# Patient Record
Sex: Female | Born: 1940 | Race: White | Hispanic: No | Smoking: Never smoker
Health system: Southern US, Community
[De-identification: ages and names within clinical notes are randomized; demographics above are authoritative.]

## PROBLEM LIST (undated history)

## (undated) DIAGNOSIS — C4491 Basal cell carcinoma of skin, unspecified: Secondary | ICD-10-CM

## (undated) DIAGNOSIS — L570 Actinic keratosis: Secondary | ICD-10-CM

## (undated) HISTORY — DX: Basal cell carcinoma of skin, unspecified: C44.91

## (undated) HISTORY — PX: OOPHORECTOMY: SHX86

## (undated) HISTORY — PX: ABDOMINAL HYSTERECTOMY: SHX81

## (undated) HISTORY — DX: Actinic keratosis: L57.0

---

## 2018-01-18 DIAGNOSIS — E785 Hyperlipidemia, unspecified: Secondary | ICD-10-CM | POA: Insufficient documentation

## 2018-01-23 ENCOUNTER — Other Ambulatory Visit: Payer: Self-pay | Admitting: Certified Nurse Midwife

## 2018-01-23 DIAGNOSIS — Z1231 Encounter for screening mammogram for malignant neoplasm of breast: Secondary | ICD-10-CM

## 2018-02-02 ENCOUNTER — Other Ambulatory Visit: Payer: Self-pay | Admitting: *Deleted

## 2018-02-02 ENCOUNTER — Inpatient Hospital Stay
Admission: RE | Admit: 2018-02-02 | Discharge: 2018-02-02 | Disposition: A | Discharge status: Home / Self Care | Payer: Self-pay | Source: Ambulatory Visit | Attending: *Deleted | Admitting: *Deleted

## 2018-02-02 DIAGNOSIS — Z9289 Personal history of other medical treatment: Secondary | ICD-10-CM

## 2018-02-07 ENCOUNTER — Encounter: Payer: Self-pay | Admitting: Radiology

## 2018-02-07 ENCOUNTER — Ambulatory Visit
Admission: RE | Admit: 2018-02-07 | Discharge: 2018-02-07 | Disposition: A | Discharge status: Home / Self Care | Payer: Medicare HMO | Source: Ambulatory Visit | Attending: Certified Nurse Midwife | Admitting: Certified Nurse Midwife

## 2018-02-07 DIAGNOSIS — Z1231 Encounter for screening mammogram for malignant neoplasm of breast: Secondary | ICD-10-CM

## 2019-03-14 ENCOUNTER — Other Ambulatory Visit: Payer: Self-pay | Admitting: Internal Medicine

## 2019-03-14 DIAGNOSIS — Z1231 Encounter for screening mammogram for malignant neoplasm of breast: Secondary | ICD-10-CM

## 2019-05-03 ENCOUNTER — Ambulatory Visit
Admission: RE | Admit: 2019-05-03 | Discharge: 2019-05-03 | Disposition: A | Discharge status: Home / Self Care | Payer: Medicare HMO | Source: Ambulatory Visit | Attending: Internal Medicine | Admitting: Internal Medicine

## 2019-05-03 DIAGNOSIS — Z1231 Encounter for screening mammogram for malignant neoplasm of breast: Secondary | ICD-10-CM | POA: Diagnosis present

## 2020-01-17 ENCOUNTER — Other Ambulatory Visit: Payer: Self-pay

## 2020-01-17 ENCOUNTER — Encounter: Payer: Self-pay | Admitting: Urology

## 2020-01-17 ENCOUNTER — Ambulatory Visit (INDEPENDENT_AMBULATORY_CARE_PROVIDER_SITE_OTHER): Payer: Medicare HMO | Admitting: Urology

## 2020-01-17 VITALS — BP 135/83 | HR 68 | Ht 64.0 in | Wt 190.0 lb

## 2020-01-17 DIAGNOSIS — N390 Urinary tract infection, site not specified: Secondary | ICD-10-CM | POA: Diagnosis not present

## 2020-01-18 ENCOUNTER — Encounter: Payer: Self-pay | Admitting: Urology

## 2020-01-18 NOTE — Progress Notes (Signed)
   01/17/2020 7:17 AM   Jessica Brewer 08/06/1940 [id number]  Referring provider: Adin Hector, MD [facility] [place],  [place] [address]  Chief Complaint  Patient presents with  . Recurrent UTI    HPI: Jessica Brewer is a 79 y.o. female seen at request of Dr. Caryl Brewer for recurrent UTI.   States she has been treated for 3 UTIs over the last 6 weeks  Review of available records remarkable for urinalysis 12/04/2019 showing pyuria and urine culture positive E. Coli  Typical symptoms include urinary urgency with pain that radiates up abdomen to chest, dysuria and voiding small amounts  Symptoms resolved with antibiotic therapy  Completed antibiotic course approximately 1 week ago  No symptoms related to intercourse  No febrile UTIs or pyelonephritis  Taking cranberry supplements   PMH: No past medical history on file.  Surgical History: Past Surgical History:  Procedure Laterality Date  . ABDOMINAL HYSTERECTOMY    . OOPHORECTOMY      Home Medications:  Allergies as of 01/17/2020      Reactions   Codeine Other (See Comments)   Sweats, Chills   Sulfa Antibiotics Other (See Comments), Nausea Only   Pravastatin Other (See Comments)      Medication List       Accurate as of January 17, 2020 11:59 PM. If you have any questions, ask your nurse or doctor.        albuterol 108 (90 Base) MCG/ACT inhaler Commonly known as: VENTOLIN HFA SMARTSIG:2 Puff(s) By Mouth Brewer 6 Hours PRN   ascorbic acid 500 MG tablet Commonly known as: VITAMIN C Take by mouth.   benzonatate 200 MG capsule Commonly known as: TESSALON Take by mouth.   Biotin 1 MG Caps Take by mouth.   loratadine 10 MG tablet Commonly known as: CLARITIN Take by mouth.   Maximum Red Krill 300 MG Caps Take by mouth.   SM Cranberry 300 MG tablet Generic drug: Cranberry Take by mouth.       Allergies:  Allergies  Allergen Reactions  . Codeine Other (See Comments)     Sweats, Chills  . Sulfa Antibiotics Other (See Comments) and Nausea Only  . Pravastatin Other (See Comments)    Family History: Family History  Problem Relation Age of Onset  . Breast cancer Mother     Social History:  reports that she has never smoked. She has never used smokeless tobacco. She reports current alcohol use. No history on file for drug use.   Physical Exam: BP (!) 135/83   Pulse 68   Ht 5\' 4"  (1.626 m)   Wt 190 lb (86.2 kg)   BMI 32.61 kg/m   Constitutional:  Alert and oriented, No acute distress. HEENT: [place] AT, moist mucus membranes.  Trachea midline, no masses. Cardiovascular: No clubbing, cyanosis, or edema. Respiratory: Normal respiratory effort, no increased work of breathing. Skin: No rashes, bruises or suspicious lesions. Neurologic: Grossly intact, no focal deficits, moving all 4 extremities. Psychiatric: Normal mood and affect.   Assessment & Plan:    1. Recurrent UTI  Voided prior to office visit however urinalysis PCP earlier this week was negative  Schedule screening renal ultrasound with follow-up  Discussed etiologies of recurrent UTIs in postmenopausal women  Continue cranberry  Start D-mannose   Jessica Sons, MD  [facility] [address], [place], [place] [address] [phone]

## 2020-02-06 ENCOUNTER — Other Ambulatory Visit: Payer: Self-pay | Admitting: Certified Nurse Midwife

## 2020-02-06 DIAGNOSIS — Z1231 Encounter for screening mammogram for malignant neoplasm of breast: Secondary | ICD-10-CM

## 2020-02-21 ENCOUNTER — Ambulatory Visit: Payer: Medicare HMO | Admitting: Dermatology

## 2020-02-21 ENCOUNTER — Other Ambulatory Visit: Payer: Self-pay

## 2020-02-21 ENCOUNTER — Encounter: Payer: Self-pay | Admitting: Dermatology

## 2020-02-21 DIAGNOSIS — L821 Other seborrheic keratosis: Secondary | ICD-10-CM

## 2020-02-21 DIAGNOSIS — L570 Actinic keratosis: Secondary | ICD-10-CM

## 2020-02-21 DIAGNOSIS — L578 Other skin changes due to chronic exposure to nonionizing radiation: Secondary | ICD-10-CM

## 2020-02-21 DIAGNOSIS — Z85828 Personal history of other malignant neoplasm of skin: Secondary | ICD-10-CM

## 2020-02-21 DIAGNOSIS — L738 Other specified follicular disorders: Secondary | ICD-10-CM | POA: Diagnosis not present

## 2020-02-21 DIAGNOSIS — L820 Inflamed seborrheic keratosis: Secondary | ICD-10-CM

## 2020-02-21 DIAGNOSIS — Z1283 Encounter for screening for malignant neoplasm of skin: Secondary | ICD-10-CM

## 2020-02-21 NOTE — Progress Notes (Signed)
New Patient Visit  Subjective  Jessica Brewer is a 79 y.o. female who presents for the following: multiple scattered skin lesions (irregular and irritated - R cheek, forehead, legs, neck. Patient is concerned and would like them checked). The patient presents for Upper Body Skin Exam (UBSE) for skin cancer screening and mole check.  The following portions of the chart were reviewed this encounter and updated as appropriate:  Tobacco  Allergies  Meds  Problems  Med Hx  Surg Hx  Fam Hx     Review of Systems:  No other skin or systemic complaints except as noted in HPI or Assessment and Plan.  Objective  Well appearing patient in no apparent distress; mood and affect are within normal limits.  A focused examination was performed including face, neck, arms, legs. Relevant physical exam findings are noted in the Assessment and Plan.  Objective  R of midline forehead x 1, R calf x 1, L pretibial x 1, B/L arms x 3, L shoulder x 14 (20): Erythematous keratotic or waxy stuck-on papule or plaque.   Objective  R cheek prox mandible: Erythematous thin papules/macules with gritty scale.   Objective  Face: Small yellow papules with a central dell.   Objective  Nose: Well healed scar with no evidence of recurrence.   Assessment & Plan  Inflamed seborrheic keratosis (20) R of midline forehead x 1, R calf x 1, L pretibial x 1, B/L arms x 3, L shoulder x 14  On the L pretibial there is an ISK with underlying cyst - if not resolved at follow up we may have to biopsy lesion.  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    History of basal cell carcinoma (BCC) Nose  Clear. Observe for recurrence. Call clinic for new or changing lesions.  Recommend regular skin exams, daily broad-spectrum spf 30+ sunscreen use, and photoprotection.     Skin cancer screening   Seborrheic Keratoses - Stuck-on, waxy, tan-brown papules and plaques  - Discussed benign etiology and prognosis. - Observe - Call for any changes  Actinic Damage - diffuse scaly erythematous macules with underlying dyspigmentation - Recommend daily broad spectrum sunscreen SPF 30+ to sun-exposed areas, reapply every 2 hours as needed.  - Call for new or changing lesions.  Return in about 2 months (around 04/22/2020).  Luther Redo, CMA, am acting as scribe for Sarina Ser, MD .  Documentation: I have reviewed the above documentation for  accuracy and completeness, and I agree with the above.  Sarina Ser, MD

## 2020-02-22 ENCOUNTER — Ambulatory Visit: Payer: Medicare HMO | Admitting: Physician Assistant

## 2020-02-22 VITALS — BP 169/98 | HR 73

## 2020-02-22 DIAGNOSIS — R300 Dysuria: Secondary | ICD-10-CM

## 2020-02-22 LAB — URINALYSIS, COMPLETE
Bilirubin, UA: NEGATIVE
Glucose, UA: NEGATIVE
Ketones, UA: NEGATIVE
Nitrite, UA: NEGATIVE
Protein,UA: NEGATIVE
Specific Gravity, UA: 1.015 (ref 1.005–1.030)
Urobilinogen, Ur: 0.2 mg/dL (ref 0.2–1.0)
pH, UA: 6 (ref 5.0–7.5)

## 2020-02-22 LAB — MICROSCOPIC EXAMINATION: WBC, UA: >30 /hpf — AB (ref 0–5)

## 2020-02-22 MED ORDER — NITROFURANTOIN MONOHYD MACRO 100 MG PO CAPS: 100.0000 mg | ORAL_CAPSULE | Freq: Two times a day (BID) | ORAL | 0 refills | Status: AC

## 2020-02-22 NOTE — Progress Notes (Signed)
02/22/2020 3:10 PM   Jessica Brewer 1941/04/11 [id number]  CC: Chief Complaint  Patient presents with  . Recurrent UTI    HPI: Jessica Brewer is a 79 y.o. female with a recent history of recurrent UTI who presents today for evaluation of possible UTI. She is an established [facility] patient who last saw Dr. Bernardo Heater on January 17, 2020 for evaluation of recurrent UTI.  She is scheduled for follow-up with renal ultrasound prior later this month.  Today she reports a 1 day history of urinary frequency and dysuria.  Consistent with prior, she reports pain that radiates of her abdomen to her chest with this.  She denies fever, chills, nausea, vomiting, and gross hematuria.  She has not taken any medications at home to treat her symptoms.  Per chart review, most recent positive urine culture dated 12/04/2019 resulted with pansensitive E. coli.  In-office UA today positive for 2+ blood and 2+ leukocyte esterase; urine microscopy with >30 WBCs/HPF.  PMH: Past Medical History:  Diagnosis Date  . Basal cell carcinoma    nose - treated in [place]    Surgical History: Past Surgical History:  Procedure Laterality Date  . ABDOMINAL HYSTERECTOMY    . OOPHORECTOMY      Home Medications:  Allergies as of 02/22/2020      Reactions   Codeine Other (See Comments)   Sweats, Chills   Sulfa Antibiotics Other (See Comments), Nausea Only   Pravastatin Other (See Comments)      Medication List       Accurate as of February 22, 2020  3:10 PM. If you have any questions, ask your nurse or doctor.        Advair Diskus 250-50 MCG/DOSE Aepb Generic drug: Fluticasone-Salmeterol SMARTSIG:1 Inhalation Via Inhaler Every 12 Hours   albuterol 108 (90 Base) MCG/ACT inhaler Commonly known as: VENTOLIN HFA SMARTSIG:2 Puff(s) By Mouth Every 6 Hours PRN   ascorbic acid 500 MG tablet Commonly known as: VITAMIN C Take by mouth.   benzonatate 200 MG capsule Commonly known as: TESSALON Take by mouth.   Biotin 1 MG  Caps Take by mouth.   loratadine 10 MG tablet Commonly known as: CLARITIN Take by mouth.   Maximum Red Krill 300 MG Caps Take by mouth.   nitrofurantoin (macrocrystal-monohydrate) 100 MG capsule Commonly known as: MACROBID Take 1 capsule (100 mg total) by mouth every 12 (twelve) hours for 5 days.   SM Cranberry 300 MG tablet Generic drug: Cranberry Take by mouth.      Allergies:  Allergies  Allergen Reactions  . Codeine Other (See Comments)    Sweats, Chills  . Sulfa Antibiotics Other (See Comments) and Nausea Only  . Pravastatin Other (See Comments)    Family History: Family History  Problem Relation Age of Onset  . Breast cancer Mother     Social History:   reports that she has never smoked. She has never used smokeless tobacco. She reports current alcohol use. No history on file for drug use.  Physical Exam: BP (!) 169/98   Pulse 73   Constitutional:  Alert and oriented, no acute distress, nontoxic appearing HEENT: [place], AT Cardiovascular: No clubbing, cyanosis, or edema Respiratory: Normal respiratory effort, no increased work of breathing Skin: No rashes, bruises or suspicious lesions Neurologic: Grossly intact, no focal deficits, moving all 4 extremities Psychiatric: Normal mood and affect  Laboratory Data: Results for orders placed or performed in visit on 02/22/20  CULTURE, URINE COMPREHENSIVE   Specimen: Urine  UR  Result Value Ref Range   Urine Culture, Comprehensive Preliminary report (A)    Organism ID, Bacteria Gram negative rods (A)   Microscopic Examination   Urine  Result Value Ref Range   WBC, UA >30 (A) 0 - 5 /hpf   RBC 0-2 0 - 2 /hpf   Epithelial Cells (non renal) 0-10 0 - 10 /hpf   Bacteria, UA Few None seen/Few  Urinalysis, Complete  Result Value Ref Range   Specific Gravity, UA 1.015 1.005 - 1.030   pH, UA 6.0 5.0 - 7.5   Color, UA Yellow Yellow   Appearance Ur Cloudy (A) Clear   Leukocytes,UA 2+ (A) Negative   Protein,UA  Negative Negative/Trace   Glucose, UA Negative Negative   Ketones, UA Negative Negative   RBC, UA 2+ (A) Negative   Bilirubin, UA Negative Negative   Urobilinogen, Ur 0.2 0.2 - 1.0 mg/dL   Nitrite, UA Negative Negative   Microscopic Examination See below:     Assessment & Plan:   1. Dysuria UA today notable for pyuria consistent with prior past positive urine culture.  I am starting her on empiric Macrobid and sending her urine for culture today for further evaluation.  I explained that I would contact her if her culture results indicate a necessary change in therapy.  She expressed understanding. - Urinalysis, Complete - CULTURE, URINE COMPREHENSIVE - nitrofurantoin, macrocrystal-monohydrate, (MACROBID) 100 MG capsule; Take 1 capsule (100 mg total) by mouth every 12 (twelve) hours for 5 days.  Dispense: 10 capsule; Refill: 0  Return if symptoms worsen or fail to improve.  Debroah Loop, PA-C  [facility] [address], [place], [place] [address] [phone]

## 2020-02-27 ENCOUNTER — Encounter: Payer: Self-pay | Admitting: Dermatology

## 2020-02-29 LAB — CULTURE, URINE COMPREHENSIVE

## 2020-03-03 ENCOUNTER — Ambulatory Visit
Admission: RE | Admit: 2020-03-03 | Discharge: 2020-03-03 | Disposition: A | Discharge status: Home / Self Care | Payer: Medicare HMO | Source: Ambulatory Visit | Attending: Urology | Admitting: Urology

## 2020-03-03 ENCOUNTER — Other Ambulatory Visit: Payer: Self-pay

## 2020-03-03 DIAGNOSIS — N390 Urinary tract infection, site not specified: Secondary | ICD-10-CM | POA: Insufficient documentation

## 2020-03-07 ENCOUNTER — Ambulatory Visit: Payer: Medicare HMO | Admitting: Urology

## 2020-03-07 ENCOUNTER — Other Ambulatory Visit: Payer: Self-pay

## 2020-03-07 ENCOUNTER — Encounter: Payer: Self-pay | Admitting: Urology

## 2020-03-07 VITALS — BP 136/75 | HR 75 | Ht 64.0 in | Wt 194.0 lb

## 2020-03-07 DIAGNOSIS — R300 Dysuria: Secondary | ICD-10-CM

## 2020-03-07 DIAGNOSIS — N390 Urinary tract infection, site not specified: Secondary | ICD-10-CM

## 2020-03-07 MED ORDER — TRIMETHOPRIM 100 MG PO TABS: 100.0000 mg | ORAL_TABLET | Freq: Every day | ORAL | 0 refills | Status: AC

## 2020-03-07 NOTE — Progress Notes (Signed)
03/07/2020 12:34 PM   Jessica Brewer 1941-03-05 [id number]  Referring provider: Adin Hector, MD [facility] [place],  [place] [address]  Chief Complaint  Patient presents with  . Results    40mo w/RUS    HPI: 79 y.o. female presents for follow-up of recurrent UTI.   I initially saw 01/17/2020 and recommended d-mannose, cranberry and screening renal ultrasound  Saw Sam Vaillancourt earlier this month with recurrent symptoms and urine culture positive for E. Coli  Renal ultrasound performed 03/03/2020 showed no significant abnormalities.  There was a small 13 mm simple left renal cyst  Resolution of symptoms after completion of recent antibiotic course   PMH: Past Medical History:  Diagnosis Date  . Basal cell carcinoma    nose - [place] in [place]    Surgical History: Past Surgical History:  Procedure Laterality Date  . ABDOMINAL HYSTERECTOMY    . OOPHORECTOMY      Home Medications:  Allergies as of 03/07/2020      Reactions   Codeine Other (See Comments)   Sweats, Chills   Sulfa Antibiotics Other (See Comments), Nausea Only   Pravastatin Other (See Comments)      Medication List       Accurate as of March 07, 2020 12:34 PM. If you have any questions, ask your nurse or doctor.        STOP taking these medications   albuterol 108 (90 Base) MCG/ACT inhaler Commonly known as: VENTOLIN HFA Stopped by: Abbie Sons, MD     TAKE these medications   Advair Diskus 250-50 MCG/DOSE Aepb Generic drug: Fluticasone-Salmeterol SMARTSIG:1 Inhalation Via Inhaler Brewer 12 Hours   ascorbic acid 500 MG tablet Commonly known as: VITAMIN C Take by mouth.   benzonatate 200 MG capsule Commonly known as: TESSALON Take by mouth.   Biotin 1 MG Caps Take by mouth.   loratadine 10 MG tablet Commonly known as: CLARITIN Take by mouth.   Maximum Red Krill 300 MG Caps Take by mouth.   SM Cranberry 300 MG tablet Generic drug:  Cranberry Take by mouth.       Allergies:  Allergies  Allergen Reactions  . Codeine Other (See Comments)    Sweats, Chills  . Sulfa Antibiotics Other (See Comments) and Nausea Only  . Pravastatin Other (See Comments)    Family History: Family History  Problem Relation Age of Onset  . Breast cancer Mother     Social History:  reports that she has never smoked. She has never used smokeless tobacco. She reports current alcohol use. No history on file for drug use.   Physical Exam: BP 136/75   Pulse 75   Ht 5\' 4"  (1.626 m)   Wt 194 lb (88 kg)   BMI 33.30 kg/m   Constitutional:  Alert and oriented, No acute distress. HEENT: [place] AT, moist mucus membranes.  Trachea midline, no masses. Cardiovascular: No clubbing, cyanosis, or edema. Respiratory: Normal respiratory effort, no increased work of breathing. Skin: No rashes, bruises or suspicious lesions. Neurologic: Grossly intact, no focal deficits, moving all 4 extremities. Psychiatric: Normal mood and affect.   Pertinent Imaging: Images were personally reviewed  Ultrasound renal complete  Narrative CLINICAL DATA:  Recurring UTI.  EXAM: RENAL / URINARY TRACT ULTRASOUND COMPLETE  COMPARISON:  None.  FINDINGS: Right Kidney:  Renal measurements: 11.8 cm x 3.7 cm x 5.2 cm = volume: 117.2 mL. Echogenicity within normal limits. No mass or hydronephrosis visualized.  Left  Kidney:  Renal measurements: 10.2 cm x 5.9 cm x 4.5 cm = volume: 142.6 mL. Echogenicity within normal limits. A 1.3 cm x 1.1 cm x 1.0 cm anechoic structure is seen within the lower pole of the left kidney. No hydronephrosis visualized.  Bladder:  Appears normal for degree of bladder distention.  Other:  None.  IMPRESSION: Small left renal cyst.   Electronically Signed By: Virgina Norfolk M.D. On: 03/04/2020 03:52   Assessment & Plan:    1. Recurrent UTI  Continue cranberry, d-mannose  84-month trial low-dose antibiotic  prophylaxis trimethoprim 100 mg daily  PA follow-up 3 months  Call earlier for recurrent UTI symptoms   Abbie Sons, MD  [facility] [address], [place], [place] [address] [phone]

## 2020-03-09 ENCOUNTER — Encounter: Payer: Self-pay | Admitting: Urology

## 2020-03-10 LAB — URINALYSIS, COMPLETE
Bilirubin, UA: NEGATIVE
Glucose, UA: NEGATIVE
Ketones, UA: NEGATIVE
Leukocytes,UA: NEGATIVE
Nitrite, UA: NEGATIVE
Protein,UA: NEGATIVE
RBC, UA: NEGATIVE
Specific Gravity, UA: 1.02 (ref 1.005–1.030)
Urobilinogen, Ur: 0.2 mg/dL (ref 0.2–1.0)
pH, UA: 5.5 (ref 5.0–7.5)

## 2020-03-10 LAB — MICROSCOPIC EXAMINATION: Bacteria, UA: NONE SEEN

## 2020-04-23 ENCOUNTER — Other Ambulatory Visit: Payer: Self-pay

## 2020-04-23 ENCOUNTER — Encounter: Payer: Self-pay | Admitting: Dermatology

## 2020-04-23 ENCOUNTER — Ambulatory Visit: Payer: Medicare HMO | Admitting: Dermatology

## 2020-04-23 DIAGNOSIS — L578 Other skin changes due to chronic exposure to nonionizing radiation: Secondary | ICD-10-CM

## 2020-04-23 DIAGNOSIS — I781 Nevus, non-neoplastic: Secondary | ICD-10-CM | POA: Diagnosis not present

## 2020-04-23 DIAGNOSIS — L821 Other seborrheic keratosis: Secondary | ICD-10-CM | POA: Diagnosis not present

## 2020-04-23 DIAGNOSIS — L570 Actinic keratosis: Secondary | ICD-10-CM

## 2020-04-23 DIAGNOSIS — L820 Inflamed seborrheic keratosis: Secondary | ICD-10-CM | POA: Diagnosis not present

## 2020-04-23 DIAGNOSIS — L719 Rosacea, unspecified: Secondary | ICD-10-CM

## 2020-04-23 MED ORDER — METRONIDAZOLE 1 % EX GEL: Freq: Every day | CUTANEOUS | 0 refills | Status: AC

## 2020-04-23 NOTE — Patient Instructions (Signed)

## 2020-04-23 NOTE — Progress Notes (Signed)
   Follow-Up Visit   Subjective  Jessica Brewer is a 79 y.o. female who presents for the following: Actinic Keratosis (follow up - right prox mandible treated with LN2) and Other (ISK follow up treated with LN2).  The following portions of the chart were reviewed this encounter and updated as appropriate:  Tobacco  Allergies  Meds  Problems  Med Hx  Surg Hx  Fam Hx     Review of Systems:  No other skin or systemic complaints except as noted in HPI or Assessment and Plan.  Objective  Well appearing patient in no apparent distress; mood and affect are within normal limits.  A focused examination was performed including trunk, arms, face. Relevant physical exam findings are noted in the Assessment and Plan.  Objective  Back x 2, left chest x 2, right tricep x 1, left tricep x 1 (6): Erythematous keratotic or waxy stuck-on papule or plaque.   Objective  Right cheek (4): Erythematous thin papules/macules with gritty scale.   Objective  Head - Anterior (Face): Erythema and small pustules.  Objective  Left 5th knuckle: Dilated blood vessels   Assessment & Plan    Actinic Damage - chronic, secondary to cumulative UV radiation exposure/sun exposure over time - diffuse scaly erythematous macules with underlying dyspigmentation - Recommend daily broad spectrum sunscreen SPF 30+ to sun-exposed areas, reapply every 2 hours as needed.  - Call for new or changing lesions.  Seborrheic Keratoses - Stuck-on, waxy, tan-brown papules and plaques  - Discussed benign etiology and prognosis. - Observe - Call for any changes   Inflamed seborrheic keratosis x 6 Back x 2, left chest x 2, right tricep x 1, left tricep x 1  Destruction of lesion - Back x 2, left chest x 2, right tricep x 1, left tricep x 1 Complexity: simple   Destruction method: cryotherapy   Informed consent: discussed and consent obtained   Timeout:  patient name, date of birth, surgical site, and procedure  verified Lesion destroyed using liquid nitrogen: Yes   Region frozen until ice ball extended beyond lesion: Yes   Outcome: patient tolerated procedure well with no complications   Post-procedure details: wound care instructions given    AK (actinic keratosis) (4) Right cheek  Destruction of lesion - Right cheek Complexity: simple   Destruction method: cryotherapy   Informed consent: discussed and consent obtained   Timeout:  patient name, date of birth, surgical site, and procedure verified Lesion destroyed using liquid nitrogen: Yes   Region frozen until ice ball extended beyond lesion: Yes   Outcome: patient tolerated procedure well with no complications   Post-procedure details: wound care instructions given    Rosacea Head - Anterior (Face) Rosacea is a chronic progressive skin condition usually affecting the face of adults. It is treatable but not curable. It sometimes affects the eyes (ocular rosacea) as well. It may respond to topical and/or systemic medication and can flare with stress, sun exposure, alcohol, exercise and some foods.  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   Return in about 6 months (around 10/21/2020).   I, Ashok Cordia, CMA, am acting as scribe for Sarina Ser, MD .  Documentation: I have reviewed the above documentation for accuracy and completeness, and I agree with the above.  Sarina Ser, MD

## 2020-04-29 ENCOUNTER — Telehealth: Payer: Self-pay

## 2020-04-29 NOTE — Telephone Encounter (Signed)
Send [facility] Triple Rosacea cream qhs. 6 refills Review with pt cost and how she will get in mail; needs email etc.

## 2020-04-29 NOTE — Telephone Encounter (Signed)
Pt left vm requesting an alternative to the metronidazole. She states that it was too expensive, $150. Please advise.

## 2020-04-30 NOTE — Telephone Encounter (Signed)
Pt was okay with price of cream from [facility]. Rx sent. Pt advised to keep an eye on her email to order.

## 2020-05-06 ENCOUNTER — Ambulatory Visit
Admission: RE | Admit: 2020-05-06 | Discharge: 2020-05-06 | Disposition: A | Discharge status: Home / Self Care | Payer: Medicare HMO | Source: Ambulatory Visit | Attending: Certified Nurse Midwife | Admitting: Certified Nurse Midwife

## 2020-05-06 ENCOUNTER — Other Ambulatory Visit: Payer: Self-pay

## 2020-05-06 DIAGNOSIS — Z1231 Encounter for screening mammogram for malignant neoplasm of breast: Secondary | ICD-10-CM | POA: Diagnosis not present

## 2020-06-09 ENCOUNTER — Ambulatory Visit: Payer: Medicare HMO | Admitting: Physician Assistant

## 2020-10-22 ENCOUNTER — Ambulatory Visit: Payer: Medicare HMO | Admitting: Dermatology

## 2020-10-22 ENCOUNTER — Other Ambulatory Visit: Payer: Self-pay

## 2020-10-22 DIAGNOSIS — L578 Other skin changes due to chronic exposure to nonionizing radiation: Secondary | ICD-10-CM

## 2020-10-22 DIAGNOSIS — L570 Actinic keratosis: Secondary | ICD-10-CM | POA: Diagnosis not present

## 2020-10-22 DIAGNOSIS — L820 Inflamed seborrheic keratosis: Secondary | ICD-10-CM | POA: Diagnosis not present

## 2020-10-22 NOTE — Patient Instructions (Signed)

## 2020-10-22 NOTE — Progress Notes (Signed)
   Follow-Up Visit   Subjective  Jessica Brewer is a 80 y.o. female who presents for the following: Actinic Keratosis (6 month follow up of face treated with LN2) and Other (Spots of right arm, face, and chest that are scaly).  The following portions of the chart were reviewed this encounter and updated as appropriate:   Tobacco  Allergies  Meds  Problems  Med Hx  Surg Hx  Fam Hx     Review of Systems:  No other skin or systemic complaints except as noted in HPI or Assessment and Plan.  Objective  Well appearing patient in no apparent distress; mood and affect are within normal limits.  A focused examination was performed including face, arms, chest. Relevant physical exam findings are noted in the Assessment and Plan.  Objective  Forehead (6): Erythematous thin papules/macules with gritty scale.   Objective  Right arm bicep x 2, chest x 3 (5): Erythematous keratotic or waxy stuck-on papule or plaque.    Assessment & Plan    Actinic Damage - chronic, secondary to cumulative UV radiation exposure/sun exposure over time - diffuse scaly erythematous macules with underlying dyspigmentation - Recommend daily broad spectrum sunscreen SPF 30+ to sun-exposed areas, reapply every 2 hours as needed.  - Recommend staying in the shade or wearing long sleeves, sun glasses (UVA+UVB protection) and wide brim hats (4-inch brim around the entire circumference of the hat). - Call for new or changing lesions.  AK (actinic keratosis) (6) Forehead  Destruction of lesion - Forehead Complexity: simple   Destruction method: cryotherapy   Informed consent: discussed and consent obtained   Timeout:  patient name, date of birth, surgical site, and procedure verified Lesion destroyed using liquid nitrogen: Yes   Region frozen until ice ball extended beyond lesion: Yes   Outcome: patient tolerated procedure well with no complications   Post-procedure details: wound care instructions given     Inflamed seborrheic keratosis (5) Right arm bicep x 2, chest x 3  Destruction of lesion - Right arm bicep x 2, chest x 3 Complexity: simple   Destruction method: cryotherapy   Informed consent: discussed and consent obtained   Timeout:  patient name, date of birth, surgical site, and procedure verified Lesion destroyed using liquid nitrogen: Yes   Region frozen until ice ball extended beyond lesion: Yes   Outcome: patient tolerated procedure well with no complications   Post-procedure details: wound care instructions given    Return in about 1 year (around 10/22/2021).  I, Ashok Cordia, CMA, am acting as scribe for Sarina Ser, MD .  Documentation: I have reviewed the above documentation for accuracy and completeness, and I agree with the above.  Sarina Ser, MD

## 2020-10-25 ENCOUNTER — Encounter: Payer: Self-pay | Admitting: Dermatology

## 2021-03-27 ENCOUNTER — Other Ambulatory Visit: Payer: Self-pay | Admitting: Certified Nurse Midwife

## 2021-03-27 DIAGNOSIS — N1831 Chronic kidney disease, stage 3a: Secondary | ICD-10-CM | POA: Insufficient documentation

## 2021-03-27 DIAGNOSIS — Z1231 Encounter for screening mammogram for malignant neoplasm of breast: Secondary | ICD-10-CM

## 2021-05-07 ENCOUNTER — Ambulatory Visit
Admission: RE | Admit: 2021-05-07 | Discharge: 2021-05-07 | Disposition: A | Discharge status: Home / Self Care | Payer: Medicare HMO | Source: Ambulatory Visit | Attending: Certified Nurse Midwife | Admitting: Certified Nurse Midwife

## 2021-05-07 ENCOUNTER — Other Ambulatory Visit: Payer: Self-pay

## 2021-05-07 DIAGNOSIS — Z1231 Encounter for screening mammogram for malignant neoplasm of breast: Secondary | ICD-10-CM | POA: Insufficient documentation

## 2021-10-28 ENCOUNTER — Ambulatory Visit: Payer: Medicare HMO | Admitting: Dermatology

## 2021-10-28 DIAGNOSIS — L578 Other skin changes due to chronic exposure to nonionizing radiation: Secondary | ICD-10-CM

## 2021-10-28 DIAGNOSIS — Z85828 Personal history of other malignant neoplasm of skin: Secondary | ICD-10-CM

## 2021-10-28 DIAGNOSIS — L570 Actinic keratosis: Secondary | ICD-10-CM

## 2021-10-28 DIAGNOSIS — L821 Other seborrheic keratosis: Secondary | ICD-10-CM

## 2021-10-28 DIAGNOSIS — L820 Inflamed seborrheic keratosis: Secondary | ICD-10-CM

## 2021-10-28 DIAGNOSIS — D692 Other nonthrombocytopenic purpura: Secondary | ICD-10-CM

## 2021-10-28 DIAGNOSIS — I8393 Asymptomatic varicose veins of bilateral lower extremities: Secondary | ICD-10-CM

## 2021-10-28 DIAGNOSIS — L814 Other melanin hyperpigmentation: Secondary | ICD-10-CM | POA: Diagnosis not present

## 2021-10-28 NOTE — Patient Instructions (Signed)

## 2021-10-28 NOTE — Progress Notes (Signed)
Follow-Up Visit   Subjective  Jessica Brewer is a 81 y.o. female who presents for the following: Sun exposed areas annual check (Patient has noticed  numerous irregular and irritated skin lesions scattered on the face, trunk, extremities that she would like checked today). The patient has spots, moles and lesions to be evaluated, some may be new or changing.  The following portions of the chart were reviewed this encounter and updated as appropriate:   Tobacco  Allergies  Meds  Problems  Med Hx  Surg Hx  Fam Hx     Review of Systems:  No other skin or systemic complaints except as noted in HPI or Assessment and Plan.  Objective  Well appearing patient in no apparent distress; mood and affect are within normal limits.  A focused examination was performed including the face, arms, legs, chest, back. Relevant physical exam findings are noted in the Assessment and Plan.  R forearm x 1, R cheek x 2, R upper back x 1, L shoulder x 1 (5) Erythematous stuck-on, waxy papule or plaque  R lat forehead x 1, glabella x 1, R temple x 1 (3) Erythematous thin papules/macules with gritty scale.    Assessment & Plan  Inflamed seborrheic keratosis (5) R forearm x 1, R cheek x 2, R upper back x 1, L shoulder x 1  Destruction of lesion - R forearm x 1, R cheek x 2, R upper back x 1, L shoulder x 1 Complexity: simple   Destruction method: cryotherapy   Informed consent: discussed and consent obtained   Timeout:  patient name, date of birth, surgical site, and procedure verified Lesion destroyed using liquid nitrogen: Yes   Region frozen until ice ball extended beyond lesion: Yes   Outcome: patient tolerated procedure well with no complications   Post-procedure details: wound care instructions given    AK (actinic keratosis) (3) R lat forehead x 1, glabella x 1, R temple x 1  R lat forehead - hypertrophic, if not resolved in 6 wks RTC  Destruction of lesion - R lat forehead x 1, glabella x  1, R temple x 1 Complexity: simple   Destruction method: cryotherapy   Informed consent: discussed and consent obtained   Timeout:  patient name, date of birth, surgical site, and procedure verified Lesion destroyed using liquid nitrogen: Yes   Region frozen until ice ball extended beyond lesion: Yes   Outcome: patient tolerated procedure well with no complications   Post-procedure details: wound care instructions given    Actinic Damage - chronic, secondary to cumulative UV radiation exposure/sun exposure over time - diffuse scaly erythematous macules with underlying dyspigmentation - Recommend daily broad spectrum sunscreen SPF 30+ to sun-exposed areas, reapply every 2 hours as needed.  - Recommend staying in the shade or wearing long sleeves, sun glasses (UVA+UVB protection) and wide brim hats (4-inch brim around the entire circumference of the hat). - Call for new or changing lesions.  Seborrheic Keratoses - Stuck-on, waxy, tan-brown papules and/or plaques  - Benign-appearing - Discussed benign etiology and prognosis. - Observe - Call for any changes  Lentigines - Scattered tan macules - Due to sun exposure - Benign-appering, observe - Recommend daily broad spectrum sunscreen SPF 30+ to sun-exposed areas, reapply every 2 hours as needed. - Call for any changes  Purpura - Chronic; persistent and recurrent.  Treatable, but not curable. 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 Larger vessels may be covered if symptomatic and we would refer to vascular surgeon if treatment desired.  History of Basal Cell Carcinoma of the Skin - No evidence of recurrence today - Recommend regular full body skin exams - Recommend daily broad spectrum sunscreen SPF 30+ to sun-exposed areas, reapply every 2 hours as needed.  - Call if any new or changing lesions are noted between office visits  Return in about 1 year (around 10/29/2022) for sun exposed areas recheck.  Luther Redo, CMA, am acting as scribe for Sarina Ser, MD . Documentation: I have reviewed the above documentation for accuracy and completeness, and I agree with the above.  Sarina Ser, MD

## 2021-11-13 ENCOUNTER — Encounter: Payer: Self-pay | Admitting: Dermatology

## 2022-03-31 ENCOUNTER — Other Ambulatory Visit: Payer: Self-pay | Admitting: Internal Medicine

## 2022-03-31 DIAGNOSIS — Z1231 Encounter for screening mammogram for malignant neoplasm of breast: Secondary | ICD-10-CM

## 2022-05-11 ENCOUNTER — Ambulatory Visit
Admission: RE | Admit: 2022-05-11 | Discharge: 2022-05-11 | Disposition: A | Discharge status: Home / Self Care | Payer: Medicare HMO | Source: Ambulatory Visit | Attending: Internal Medicine | Admitting: Internal Medicine

## 2022-05-11 DIAGNOSIS — Z1231 Encounter for screening mammogram for malignant neoplasm of breast: Secondary | ICD-10-CM | POA: Insufficient documentation

## 2022-05-17 ENCOUNTER — Other Ambulatory Visit: Payer: Self-pay | Admitting: Internal Medicine

## 2022-05-17 DIAGNOSIS — E785 Hyperlipidemia, unspecified: Secondary | ICD-10-CM

## 2022-05-17 DIAGNOSIS — M542 Cervicalgia: Secondary | ICD-10-CM

## 2022-06-02 ENCOUNTER — Ambulatory Visit
Admission: RE | Admit: 2022-06-02 | Discharge: 2022-06-02 | Disposition: A | Discharge status: Home / Self Care | Payer: Medicare HMO | Source: Ambulatory Visit | Attending: Internal Medicine | Admitting: Internal Medicine

## 2022-06-02 DIAGNOSIS — M542 Cervicalgia: Secondary | ICD-10-CM

## 2022-06-02 DIAGNOSIS — E785 Hyperlipidemia, unspecified: Secondary | ICD-10-CM

## 2022-06-30 ENCOUNTER — Emergency Department
Admission: EM | Admit: 2022-06-30 | Discharge: 2022-06-30 | Disposition: A | Discharge status: Home / Self Care | Payer: Medicare HMO | Attending: Student in an Organized Health Care Education/Training Program | Admitting: Student in an Organized Health Care Education/Training Program

## 2022-06-30 ENCOUNTER — Other Ambulatory Visit: Payer: Self-pay

## 2022-06-30 ENCOUNTER — Emergency Department: Payer: Medicare HMO

## 2022-06-30 DIAGNOSIS — K5732 Diverticulitis of large intestine without perforation or abscess without bleeding: Secondary | ICD-10-CM

## 2022-06-30 DIAGNOSIS — R1032 Left lower quadrant pain: Secondary | ICD-10-CM

## 2022-06-30 DIAGNOSIS — R1030 Lower abdominal pain, unspecified: Secondary | ICD-10-CM | POA: Diagnosis present

## 2022-06-30 LAB — CBC
HCT: 46.7 % — ABNORMAL HIGH (ref 36.0–46.0)
Hemoglobin: 15 g/dL (ref 12.0–15.0)
MCH: 31.4 pg (ref 26.0–34.0)
MCHC: 32.1 g/dL (ref 30.0–36.0)
MCV: 97.7 fL (ref 80.0–100.0)
Platelets: 221 10*3/uL (ref 150–400)
RBC: 4.78 MIL/uL (ref 3.87–5.11)
RDW: 13.3 % (ref 11.5–15.5)
WBC: 7.5 10*3/uL (ref 4.0–10.5)
nRBC: 0 % (ref 0.0–0.2)

## 2022-06-30 LAB — COMPREHENSIVE METABOLIC PANEL
ALT: 16 U/L (ref 0–44)
AST: 21 U/L (ref 15–41)
Albumin: 3.9 g/dL (ref 3.5–5.0)
Alkaline Phosphatase: 52 U/L (ref 38–126)
Anion gap: 10 (ref 5–15)
BUN: 20 mg/dL (ref 8–23)
CO2: 22 mmol/L (ref 22–32)
Calcium: 9 mg/dL (ref 8.9–10.3)
Chloride: 106 mmol/L (ref 98–111)
Creatinine, Ser: 0.96 mg/dL (ref 0.44–1.00)
GFR, Estimated: >60 mL/min (ref >60)
Glucose, Bld: 129 mg/dL — ABNORMAL HIGH (ref 70–99)
Potassium: 4 mmol/L (ref 3.5–5.1)
Sodium: 138 mmol/L (ref 135–145)
Total Bilirubin: 0.7 mg/dL (ref 0.3–1.2)
Total Protein: 7.4 g/dL (ref 6.5–8.1)

## 2022-06-30 LAB — LIPASE, BLOOD: Lipase: 31 U/L (ref 11–51)

## 2022-06-30 MED ORDER — AMOXICILLIN-POT CLAVULANATE 500-125 MG PO TABS: 1.0000 | ORAL_TABLET | Freq: Two times a day (BID) | ORAL | 0 refills | Status: AC

## 2022-06-30 MED ORDER — IOHEXOL 300 MG/ML  SOLN
100.0000 mL | Freq: Once | INTRAMUSCULAR | Status: AC | PRN
  Administered 2022-06-30: 100 mL via INTRAVENOUS

## 2022-06-30 MED ORDER — ONDANSETRON 4 MG PO TBDP: 4.0000 mg | ORAL_TABLET | Freq: Three times a day (TID) | ORAL | 0 refills | Status: AC | PRN

## 2022-06-30 MED ORDER — AMOXICILLIN-POT CLAVULANATE 875-125 MG PO TABS
1.0000 | ORAL_TABLET | Freq: Once | ORAL | Status: AC
  Administered 2022-06-30: 1 via ORAL
  Filled 2022-06-30: qty 1

## 2022-06-30 NOTE — ED Provider Notes (Signed)
[facility] Provider Note    Event Date/Time   First MD Initiated Contact with Patient 06/30/22 1105     (approximate)   History   Abdominal Pain   HPI  Jessica Brewer is a 82 y.o. female history.  Cholecystectomy as well as bilateral tubal ligation presents to the ER for evaluation of 2 to 3 days for worsening lower abdominal pain.  Has had fevers at home no chills.  Pain with any movement.  No dysuria.  No flank pain.  Has had urinary tract infections in the past and this feels different.     Physical Exam   Triage Vital Signs: ED Triage Vitals [06/30/22 1024]  Enc Vitals Group     BP (!) 147/102     Pulse Rate 87     Resp 16     Temp 98.4 F (36.9 C)     Temp Source Oral     SpO2 95 %     Weight 194 lb 0.1 oz (88 kg)     Height '5\' 4"'$  (1.626 m)     Head Circumference      Peak Flow      Pain Score 6     Pain Loc      Pain Edu?      Excl. in [place]?     Most recent vital signs: Vitals:   06/30/22 1024  BP: (!) 147/102  Pulse: 87  Resp: 16  Temp: 98.4 F (36.9 C)  SpO2: 95%     Constitutional: Alert  Eyes: Conjunctivae are normal.  Head: Atraumatic. Nose: No congestion/rhinnorhea. Mouth/Throat: Mucous membranes are moist.   Neck: Painless ROM.  Cardiovascular:   Good peripheral circulation. Respiratory: Normal respiratory effort.  No retractions.  Gastrointestinal: Soft and nontender without guarding or rebound. Musculoskeletal:  no deformity Neurologic:  MAE spontaneously. No gross focal neurologic deficits are appreciated.  Skin:  Skin is warm, dry and intact. No rash noted. Psychiatric: Mood and affect are normal. Speech and behavior are normal.    ED Results / Procedures / Treatments   Labs (all labs ordered are listed, but only abnormal results are displayed) Labs Reviewed  COMPREHENSIVE METABOLIC PANEL - Abnormal; Notable for the following components:      Result Value   Glucose, Bld 129 (*)    All other components  within normal limits  CBC - Abnormal; Notable for the following components:   HCT 46.7 (*)    All other components within normal limits  LIPASE, BLOOD  URINALYSIS, ROUTINE W REFLEX MICROSCOPIC     EKG     RADIOLOGY Please see ED Course for my review and interpretation.  I personally reviewed all radiographic images ordered to evaluate for the above acute complaints and reviewed radiology reports and findings.  These findings were personally discussed with the patient.  Please see medical record for radiology report.    PROCEDURES:  Critical Care performed: no  Procedures   MEDICATIONS ORDERED IN ED: Medications  iohexol (OMNIPAQUE) 300 MG/ML solution 100 mL (100 mLs Intravenous Contrast Given 06/30/22 1340)     IMPRESSION / MDM / ASSESSMENT AND PLAN / ED COURSE  I reviewed the triage vital signs and the nursing notes.                              Differential diagnosis includes, but is not limited to, diverticulitis, colitis, appendicitis, perforation, mass, constipation, UTI, stone  Patient presenting to the ER for evaluation of symptoms as described above.  Based on symptoms, risk factors and considered above differential, this presenting complaint could reflect a potentially life-threatening illness therefore the patient will be placed on continuous pulse oximetry and telemetry for monitoring.  Laboratory evaluation will be sent to evaluate for the above complaints.  Patient is nontoxic-appearing given report of fever and worsening lower abdominal pain will order blood work as well as CT imaging for the above differential.  She is declining any IV pain medication at this time.  Will continue to observe.   Clinical Course as of 06/30/22 1416  Wed Jun 30, 2022  1410 CT ABDOMEN PELVIS W CONTRAST [PR]  1165 CT imaging on my review and interpretation without evidence of perforation.  Per radiology report there is evidence of acute uncomplicated diverticulitis.  Patient  without any drug allergies will be treated with Augmentin.  She is already on a probiotic.  We discussed return precautions.  She does appear appropriate for trial of outpatient management. [PR]    Clinical Course User Index [PR] Merlyn Lot, MD     FINAL CLINICAL IMPRESSION(S) / ED DIAGNOSES   Final diagnoses:  Left lower quadrant abdominal pain  Diverticulitis of large intestine without perforation or abscess without bleeding     Rx / DC Orders   ED Discharge Orders          Ordered    amoxicillin-clavulanate (AUGMENTIN) 500-125 MG tablet  2 times daily        06/30/22 1415    ondansetron (ZOFRAN-ODT) 4 MG disintegrating tablet  Every 8 hours PRN        06/30/22 1415             Note:  This document was prepared using Dragon voice recognition software and may include unintentional dictation errors.    Merlyn Lot, MD 06/30/22 1416

## 2022-06-30 NOTE — ED Provider Triage Note (Signed)
Emergency Medicine Provider Triage Evaluation Note  Jessica Brewer , a 82 y.o. female  was evaluated in triage.  Pt complains of abdominal pain for 4 days.  Patient was seen initially at [facility] and sent to the emergency department for evaluation.  Patient reports urinary tract infections in the past but "this is different".  Review of Systems  Positive: Fever, chills, nausea Negative: Normal BM this morning.  Physical Exam  BP (!) 147/102 (BP Location: Left Arm)   Pulse 87   Temp 98.4 F (36.9 C) (Oral)   Resp 16   Ht '5\' 4"'$  (1.626 m)   Wt 88 kg   SpO2 95%   BMI 33.30 kg/m  Gen:   Awake, no distress   Resp:  Normal effort  MSK:   Moves extremities without difficulty  Other:  No point tenderness on palpation of the abdomen.  Medical Decision Making  Medically screening exam initiated at 10:29 AM.  Appropriate orders placed.  [name] was informed that the remainder of the evaluation will be completed by another provider, this initial triage assessment does not replace that evaluation, and the importance of remaining in the ED until their evaluation is complete.     Johnn Hai, PA-C 06/30/22 1031

## 2022-06-30 NOTE — ED Triage Notes (Addendum)
Pt here from her primary's office with abd pain that started Sun. Pt states that when she uses her abd muscles that pain is much worse. Pt states pain is all lower and does not radiate. Pt denies has nausea but denies vomiting or diarrhea.

## 2022-11-03 ENCOUNTER — Encounter: Payer: Self-pay | Admitting: Dermatology

## 2022-11-03 ENCOUNTER — Ambulatory Visit: Payer: Medicare HMO | Admitting: Dermatology

## 2022-11-03 VITALS — BP 142/84

## 2022-11-03 DIAGNOSIS — Z85828 Personal history of other malignant neoplasm of skin: Secondary | ICD-10-CM

## 2022-11-03 DIAGNOSIS — W908XXA Exposure to other nonionizing radiation, initial encounter: Secondary | ICD-10-CM

## 2022-11-03 DIAGNOSIS — L578 Other skin changes due to chronic exposure to nonionizing radiation: Secondary | ICD-10-CM | POA: Diagnosis not present

## 2022-11-03 DIAGNOSIS — L821 Other seborrheic keratosis: Secondary | ICD-10-CM

## 2022-11-03 DIAGNOSIS — Z79899 Other long term (current) drug therapy: Secondary | ICD-10-CM

## 2022-11-03 DIAGNOSIS — X32XXXA Exposure to sunlight, initial encounter: Secondary | ICD-10-CM

## 2022-11-03 DIAGNOSIS — Z1283 Encounter for screening for malignant neoplasm of skin: Secondary | ICD-10-CM

## 2022-11-03 DIAGNOSIS — L820 Inflamed seborrheic keratosis: Secondary | ICD-10-CM | POA: Diagnosis not present

## 2022-11-03 DIAGNOSIS — Z872 Personal history of diseases of the skin and subcutaneous tissue: Secondary | ICD-10-CM

## 2022-11-03 DIAGNOSIS — L814 Other melanin hyperpigmentation: Secondary | ICD-10-CM

## 2022-11-03 DIAGNOSIS — Z7189 Other specified counseling: Secondary | ICD-10-CM

## 2022-11-03 DIAGNOSIS — D229 Melanocytic nevi, unspecified: Secondary | ICD-10-CM

## 2022-11-03 DIAGNOSIS — H019 Unspecified inflammation of eyelid: Secondary | ICD-10-CM

## 2022-11-03 MED ORDER — PIMECROLIMUS 1 % EX CREA: TOPICAL_CREAM | Freq: Two times a day (BID) | CUTANEOUS | 1 refills | Status: AC

## 2022-11-03 NOTE — Progress Notes (Signed)
Follow-Up Visit   Subjective  Jessica Brewer is a 82 y.o. female who presents for the following: 1 yr recheck sun exposed areas, Rash, eyelids ~2-3wks, painful, taking Allegra, pt recently started Gabapentin then developed rash eyelids, neck, chin d/c  Gabapentin ~6 wks ago, rash on neck and chin cleared with [facility] 0.1% cr, but did not use on eyelids, pt had not used any new products to face, had not been working outside and no pets, check spots neck, back irritated by necklace, scaly spot R face, hx of BCC, hx of AKs  Patient accompanied by husband who contributes to history.  The following portions of the chart were reviewed this encounter and updated as appropriate: medications, allergies, medical history  Review of Systems:  No other skin or systemic complaints except as noted in HPI or Assessment and Plan.  Objective  Well appearing patient in no apparent distress; mood and affect are within normal limits.   A focused examination was performed of the following areas: Face, arms, back  Relevant exam findings are noted in the Assessment and Plan.  R cheek x 1, Neck x 1, R shoulder x 1, R back braline x 4, R forehead x 1 (8) Stuck on waxy paps with erythema    Assessment & Plan   HISTORY OF BASAL CELL CARCINOMA OF THE SKIN - No evidence of recurrence today - Recommend regular full body skin exams - Recommend daily broad spectrum sunscreen SPF 30+ to sun-exposed areas, reapply every 2 hours as needed.  - Call if any new or changing lesions are noted between office visits  - Nose  EYELID DERMITIS  / CONTACT DERMATITIS Hx of rash eyelids, face after taking Gabapentin, neck and chin improved with TMC cr, eyelids reflared 6 wks after d/c Gabapentin Exam: erythema and edema worse on medial upper eyelids  Treatment Plan: Start Elidel bid to eyelids until clear, then prn flares Discussed/Recommend TRUE Patch Test 36   Inflamed seborrheic keratosis (8) R cheek x 1, Neck x 1, R  shoulder x 1, R back braline x 4, R forehead x 1  Symptomatic, irritating, patient would like treated.   Destruction of lesion - R cheek x 1, Neck x 1, R shoulder x 1, R back braline x 4, R forehead x 1 Complexity: simple   Destruction method: cryotherapy   Informed consent: discussed and consent obtained   Timeout:  patient name, date of birth, surgical site, and procedure verified Lesion destroyed using liquid nitrogen: Yes   Region frozen until ice ball extended beyond lesion: Yes   Outcome: patient tolerated procedure well with no complications   Post-procedure details: wound care instructions given     SEBORRHEIC KERATOSIS - Stuck-on, waxy, tan-brown papules and/or plaques  - Benign-appearing - Discussed benign etiology and prognosis. - Observe - Call for any changes  HISTORY OF PRECANCEROUS ACTINIC KERATOSIS - site(s) of PreCancerous Actinic Keratosis clear today. - these may recur and new lesions may form requiring treatment to prevent transformation into skin cancer - observe for new or changing spots and contact [facility] for appointment if occur - photoprotection with sun protective clothing; sunglasses and broad spectrum sunscreen with SPF of at least 30 + and frequent self skin exams recommended - yearly exams by a dermatologist recommended for persons with history of PreCancerous Actinic Keratoses   ACTINIC DAMAGE - chronic, secondary to cumulative UV radiation exposure/sun exposure over time - diffuse scaly erythematous macules with underlying dyspigmentation - Recommend daily broad  spectrum sunscreen SPF 30+ to sun-exposed areas, reapply every 2 hours as needed.  - Recommend staying in the shade or wearing long sleeves, sun glasses (UVA+UVB protection) and wide brim hats (4-inch brim around the entire circumference of the hat). - Call for new or changing lesions.   Return for To be scheduled for Patch test (3 appts).  I, Ardis Rowan, RMA, am acting as  scribe for Armida Sans, MD .   Documentation: I have reviewed the above documentation for accuracy and completeness, and I agree with the above.  Armida Sans, MD

## 2022-11-03 NOTE — Patient Instructions (Signed)
Cryotherapy Aftercare  Wash gently with soap and water everyday.   Apply Vaseline and Band-Aid daily until healed.     Due to recent changes in healthcare laws, you may see results of your pathology and/or laboratory studies on MyChart before the doctors have had a chance to review them. We understand that in some cases there may be results that are confusing or concerning to you. Please understand that not all results are received at the same time and often the doctors may need to interpret multiple results in order to provide you with the best plan of care or course of treatment. Therefore, we ask that you please give us 2 business days to thoroughly review all your results before contacting the office for clarification. Should we see a critical lab result, you will be contacted sooner.   If You Need Anything After Your Visit  If you have any questions or concerns for your doctor, please call our main line at [phone] and press option 4 to reach your doctor's medical assistant. If no one answers, please leave a voicemail as directed and we will return your call as soon as possible. Messages left after 4 pm will be answered the following business day.   You may also send us a message via MyChart. We typically respond to MyChart messages within 1-2 business days.  For prescription refills, please ask your pharmacy to contact our office. Our fax number is [phone].  If you have an urgent issue when the clinic is closed that cannot wait until the next business day, you can page your doctor at the number below.    Please note that while we do our best to be available for urgent issues outside of office hours, we are not available 24/7.   If you have an urgent issue and are unable to reach us, you may choose to seek medical care at your doctor's office, retail clinic, urgent care center, or emergency room.  If you have a medical emergency, please immediately call 911 or go to the  emergency department.  Pager Numbers  - Dr. Kowalski: [phone]  - Dr. Moye: [phone]  - Dr. Stewart: [phone]  In the event of inclement weather, please call our main line at [phone] for an update on the status of any delays or closures.  Dermatology Medication Tips: Please keep the boxes that topical medications come in in order to help keep track of the instructions about where and how to use these. Pharmacies typically print the medication instructions only on the boxes and not directly on the medication tubes.   If your medication is too expensive, please contact our office at [phone] option 4 or send us a message through MyChart.   We are unable to tell what your co-pay for medications will be in advance as this is different depending on your insurance coverage. However, we may be able to find a substitute medication at lower cost or fill out paperwork to get insurance to cover a needed medication.   If a prior authorization is required to get your medication covered by your insurance company, please allow us 1-2 business days to complete this process.  Drug prices often vary depending on where the prescription is filled and some pharmacies may offer cheaper prices.  The website www.goodrx.com contains coupons for medications through different pharmacies. The prices here do not account for what the cost may be with help from insurance (it may be cheaper with your insurance), but the website can   give you the price if you did not use any insurance.  - You can print the associated coupon and take it with your prescription to the pharmacy.  - You may also stop by our office during regular business hours and pick up a GoodRx coupon card.  - If you need your prescription sent electronically to a different pharmacy, notify our office through [facility] MyChart or by phone at [phone] option 4.     Si Usted Necesita Algo Despus de Su Visita  Tambin puede  enviarnos un mensaje a travs de MyChart. Por lo general respondemos a los mensajes de MyChart en el transcurso de 1 a 2 das hbiles.  Para renovar recetas, por favor pida a su farmacia que se ponga en contacto con nuestra oficina. Nuestro nmero de fax es el [phone].  Si tiene un asunto urgente cuando la clnica est cerrada y que no puede esperar hasta el siguiente da hbil, puede llamar/localizar a su doctor(a) al nmero que aparece a continuacin.   Por favor, tenga en cuenta que aunque hacemos todo lo posible para estar disponibles para asuntos urgentes fuera del horario de oficina, no estamos disponibles las 24 horas del da, los 7 das de la semana.   Si tiene un problema urgente y no puede comunicarse con nosotros, puede optar por buscar atencin mdica  en el consultorio de su doctor(a), en una clnica privada, en un centro de atencin urgente o en una sala de emergencias.  Si tiene una emergencia mdica, por favor llame inmediatamente al 911 o vaya a la sala de emergencias.  Nmeros de bper  - Dr. Kowalski: [phone]  - Dra. Moye: [phone]  - Dra. Stewart: [phone]  En caso de inclemencias del tiempo, por favor llame a nuestra lnea principal al [phone] para una actualizacin sobre el estado de cualquier retraso o cierre.  Consejos para la medicacin en dermatologa: Por favor, guarde las cajas en las que vienen los medicamentos de uso tpico para ayudarle a seguir las instrucciones sobre dnde y cmo usarlos. Las farmacias generalmente imprimen las instrucciones del medicamento slo en las cajas y no directamente en los tubos del medicamento.   Si su medicamento es muy caro, por favor, pngase en contacto con nuestra oficina llamando al [phone] y presione la opcin 4 o envenos un mensaje a travs de MyChart.   No podemos decirle cul ser su copago por los medicamentos por adelantado ya que esto es diferente dependiendo de la cobertura de su seguro.  Sin embargo, es posible que podamos encontrar un medicamento sustituto a menor costo o llenar un formulario para que el seguro cubra el medicamento que se considera necesario.   Si se requiere una autorizacin previa para que su compaa de seguros cubra su medicamento, por favor permtanos de 1 a 2 das hbiles para completar este proceso.  Los precios de los medicamentos varan con frecuencia dependiendo del lugar de dnde se surte la receta y alguna farmacias pueden ofrecer precios ms baratos.  El sitio web www.goodrx.com tiene cupones para medicamentos de diferentes farmacias. Los precios aqu no tienen en cuenta lo que podra costar con la ayuda del seguro (puede ser ms barato con su seguro), pero el sitio web puede darle el precio si no utiliz ningn seguro.  - Puede imprimir el cupn correspondiente y llevarlo con su receta a la farmacia.  - Tambin puede pasar por nuestra oficina durante el horario de atencin regular y recoger una tarjeta de cupones de GoodRx.  -   Si necesita que su receta se enve electrnicamente a una farmacia diferente, informe a nuestra oficina a travs de MyChart de  o por telfono llamando al [phone] y presione la opcin 4.  

## 2022-11-05 ENCOUNTER — Ambulatory Visit: Payer: Medicare HMO

## 2022-11-05 DIAGNOSIS — D122 Benign neoplasm of ascending colon: Secondary | ICD-10-CM

## 2022-11-05 DIAGNOSIS — Z1211 Encounter for screening for malignant neoplasm of colon: Secondary | ICD-10-CM

## 2022-11-05 DIAGNOSIS — K640 First degree hemorrhoids: Secondary | ICD-10-CM

## 2022-11-05 DIAGNOSIS — K5730 Diverticulosis of large intestine without perforation or abscess without bleeding: Secondary | ICD-10-CM

## 2022-11-09 ENCOUNTER — Ambulatory Visit (INDEPENDENT_AMBULATORY_CARE_PROVIDER_SITE_OTHER): Payer: Medicare HMO

## 2022-11-09 DIAGNOSIS — R21 Rash and other nonspecific skin eruption: Secondary | ICD-10-CM | POA: Diagnosis not present

## 2022-11-09 NOTE — Progress Notes (Signed)
Patch testing was performed on 11/09/2022 using standard technique. True test x 36 applied to back at visit today. Pt advised to keep panels dry until removal in 2 days for first read.   

## 2022-11-10 ENCOUNTER — Encounter: Payer: Self-pay | Admitting: Dermatology

## 2022-11-11 ENCOUNTER — Ambulatory Visit (INDEPENDENT_AMBULATORY_CARE_PROVIDER_SITE_OTHER): Payer: Medicare HMO

## 2022-11-11 NOTE — Progress Notes (Signed)
Patient here today for 2 day patch test removal and first read.  No visible reactions seen at this time. Patient advised that showering is okay but not to soak or scrub the testing area. Patients husband came with patient today and he was instructed on how to read the testing area once a day until her followup appointment for her final read.

## 2022-11-17 ENCOUNTER — Ambulatory Visit: Payer: Medicare HMO | Admitting: Dermatology

## 2022-11-17 DIAGNOSIS — H01134 Eczematous dermatitis of left upper eyelid: Secondary | ICD-10-CM | POA: Diagnosis not present

## 2022-11-17 DIAGNOSIS — L820 Inflamed seborrheic keratosis: Secondary | ICD-10-CM | POA: Diagnosis not present

## 2022-11-17 DIAGNOSIS — H01131 Eczematous dermatitis of right upper eyelid: Secondary | ICD-10-CM

## 2022-11-17 DIAGNOSIS — Z7189 Other specified counseling: Secondary | ICD-10-CM

## 2022-11-17 NOTE — Progress Notes (Signed)
   Follow-Up Visit   Subjective  Jessica Brewer is a 82 y.o. female who presents for the following: Eyelid dermatitis/contact dermatitis follow up - 7 day patch test - no reactions over the weekend  Accompanied by husband  The following portions of the chart were reviewed this encounter and updated as appropriate: medications, allergies, medical history  Review of Systems:  No other skin or systemic complaints except as noted in HPI or Assessment and Plan.  Objective  Well appearing patient in no apparent distress; mood and affect are within normal limits.   A focused examination was performed of the following areas: Back, face  Relevant exam findings are noted in the Assessment and Plan.  Left Temple Erythematous stuck-on, waxy papule or plaque    Assessment & Plan   Eyelid Dermatitis/Contact Dermatitis  Exam: Pinkness of upper eyelids  Treatment Plan: No reactions to TRUE Test 36 patch testing.   Continue Elidel cream bid.   Advised patient it could be related to pollens. Will plan to refer to an allergist if condition does not continue to improve over the next month.  Recommend changing from Dial soap to [place] for Sensitive Skin.   Inflamed seborrheic keratosis Left Temple  Symptomatic, irritating, patient would like treated.  Benign-appearing.  Call clinic for new or changing lesions.   Prior to procedure, discussed risks of blister formation, small wound, skin dyspigmentation, or rare scar following treatment. Recommend Vaseline ointment to treated areas while healing.   Destruction of lesion - Left Temple Complexity: simple   Destruction method: cryotherapy   Informed consent: discussed and consent obtained   Timeout:  patient name, date of birth, surgical site, and procedure verified Lesion destroyed using liquid nitrogen: Yes   Region frozen until ice ball extended beyond lesion: Yes   Outcome: patient tolerated procedure well with no complications    Post-procedure details: wound care instructions given      Return in about 8 months (around 07/20/2023).  I, Joanie Coddington, CMA, am acting as scribe for Armida Sans, MD .   Documentation: I have reviewed the above documentation for accuracy and completeness, and I agree with the above.  Armida Sans, MD

## 2022-11-17 NOTE — Patient Instructions (Signed)
No reactions to TRUE Test 36 patch testing.   Continue Elidel cream bid.   Advised patient it could be related to pollens. Will plan to refer to an allergist if condition does not continue to improve over the next month.  Recommend changing from Dial soap to [place] for Sensitive Skin.   Cryotherapy Aftercare  Wash gently with soap and water everyday.   Apply Vaseline and Band-Aid daily until healed.    Due to recent changes in healthcare laws, you may see results of your pathology and/or laboratory studies on MyChart before the doctors have had a chance to review them. We understand that in some cases there may be results that are confusing or concerning to you. Please understand that not all results are received at the same time and often the doctors may need to interpret multiple results in order to provide you with the best plan of care or course of treatment. Therefore, we ask that you please give [place] 2 business days to thoroughly review all your results before contacting the office for clarification. Should we see a critical lab result, you will be contacted sooner.   If You Need Anything After Your Visit  If you have any questions or concerns for your doctor, please call our main line at [phone] and press option 4 to reach your doctor's medical assistant. If no one answers, please leave a voicemail as directed and we will return your call as soon as possible. Messages left after 4 pm will be answered the following business day.   You may also send [place] a message via MyChart. We typically respond to MyChart messages within 1-2 business days.  For prescription refills, please ask your pharmacy to contact our office. Our fax number is [phone].  If you have an urgent issue when the clinic is closed that cannot wait until the next business day, you can page your doctor at the number below.    Please note that while we do our best to be available for urgent issues outside of office  hours, we are not available 24/7.   If you have an urgent issue and are unable to reach [place], you may choose to seek medical care at your doctor's office, retail clinic, urgent care center, or emergency room.  If you have a medical emergency, please immediately call 911 or go to the emergency department.  Pager Numbers  - Dr. Gwen Pounds: [phone]  - Dr. Neale Burly: [phone]  - Dr. Roseanne Reno: [phone]  In the event of inclement weather, please call our main line at [phone] for an update on the status of any delays or closures.  Dermatology Medication Tips: Please keep the boxes that topical medications come in in order to help keep track of the instructions about where and how to use these. Pharmacies typically print the medication instructions only on the boxes and not directly on the medication tubes.   If your medication is too expensive, please contact our office at [phone] option 4 or send [place] a message through MyChart.   We are unable to tell what your co-pay for medications will be in advance as this is different depending on your insurance coverage. However, we may be able to find a substitute medication at lower cost or fill out paperwork to get insurance to cover a needed medication.   If a prior authorization is required to get your medication covered by your insurance company, please allow [place] 1-2 business days to complete this process.  Drug prices  often vary depending on where the prescription is filled and some pharmacies may offer cheaper prices.  The website www.goodrx.com contains coupons for medications through different pharmacies. The prices here do not account for what the cost may be with help from insurance (it may be cheaper with your insurance), but the website can give you the price if you did not use any insurance.  - You can print the associated coupon and take it with your prescription to the pharmacy.  - You may also stop by our office during regular  business hours and pick up a GoodRx coupon card.  - If you need your prescription sent electronically to a different pharmacy, notify our office through [facility] or by phone at [phone] option 4.     Si Usted Necesita Algo Despus de Su Visita  Tambin puede enviarnos un mensaje a travs de Clinical cytogeneticist. Por lo general respondemos a los mensajes de MyChart en el transcurso de 1 a 2 das hbiles.  Para renovar recetas, por favor pida a su farmacia que se ponga en contacto con nuestra oficina. Annie Sable de fax es [place] [phone].  Si tiene un asunto urgente cuando la clnica est cerrada y que no puede esperar hasta el siguiente da hbil, puede llamar/localizar a su doctor(a) al nmero que aparece a continuacin.   Por favor, tenga en cuenta que aunque hacemos todo lo posible para estar disponibles para asuntos urgentes fuera del horario de [place], no estamos disponibles las 24 horas del da, los 7 [address]  [address] de la [place].   Si tiene un problema urgente y no puede comunicarse con nosotros, puede optar por buscar atencin mdica  en el consultorio de su doctor(a), en una clnica privada, en un centro de atencin urgente o en una sala de emergencias.  Si tiene Engineer, drilling, por favor llame inmediatamente al 911 o vaya a la sala de emergencias.  Nmeros de bper  - Dr. Gwen Pounds: [phone]  - Dra. Moye: [phone]  - Dra. Roseanne Reno: [phone]  En caso de inclemencias del [place], por favor llame a Lacy Duverney principal al [phone] para una actualizacin sobre el House de cualquier retraso o cierre.  Consejos para la medicacin en dermatologa: Por favor, guarde las cajas en las que vienen los medicamentos de uso tpico para ayudarle a seguir las instrucciones sobre dnde y cmo usarlos. Las farmacias generalmente imprimen las instrucciones del medicamento slo en las cajas y no directamente en los tubos del [place].   Si su medicamento es muy caro, por  favor, pngase en contacto con Rolm Gala llamando al [phone] y presione la opcin 4 o envenos un mensaje a travs de Clinical cytogeneticist.   No podemos decirle cul ser su copago por los medicamentos por adelantado ya que esto es diferente dependiendo de la cobertura de su seguro. Sin embargo, es posible que podamos encontrar un medicamento sustituto a Audiological scientist un formulario para que el seguro cubra el medicamento que se considera necesario.   Si se requiere una autorizacin previa para que su compaa de seguros [place] su medicamento, por favor permtanos de 1 a 2 das hbiles para completar [address]  Los precios de los medicamentos varan con frecuencia dependiendo del Environmental consultant de dnde se surte la receta y alguna farmacias pueden ofrecer precios ms baratos.  El sitio web www.goodrx.com tiene cupones para medicamentos de Health and safety inspector. Los precios aqu no tienen en cuenta lo que podra costar con la ayuda del seguro (puede ser ms barato con  su seguro), pero el sitio web puede darle el precio si no Visual merchandiser.  - Puede imprimir el cupn correspondiente y llevarlo con su receta a la farmacia.  - Tambin puede pasar por nuestra oficina durante el horario de atencin regular y Education officer, museum una tarjeta de cupones de GoodRx.  - Si necesita que su receta se enve electrnicamente a una farmacia diferente, informe a nuestra oficina a travs de MyChart de [place] o por telfono llamando al [phone] y presione la opcin 4.

## 2022-11-24 ENCOUNTER — Encounter: Payer: Self-pay | Admitting: Dermatology

## 2022-11-30 ENCOUNTER — Encounter: Payer: Self-pay | Admitting: Dermatology

## 2022-11-30 ENCOUNTER — Ambulatory Visit: Payer: Medicare HMO | Admitting: Dermatology

## 2022-11-30 VITALS — BP 144/60 | HR 75

## 2022-11-30 DIAGNOSIS — L820 Inflamed seborrheic keratosis: Secondary | ICD-10-CM

## 2022-11-30 DIAGNOSIS — L578 Other skin changes due to chronic exposure to nonionizing radiation: Secondary | ICD-10-CM | POA: Diagnosis not present

## 2022-11-30 DIAGNOSIS — Z79899 Other long term (current) drug therapy: Secondary | ICD-10-CM

## 2022-11-30 DIAGNOSIS — W908XXA Exposure to other nonionizing radiation, initial encounter: Secondary | ICD-10-CM | POA: Diagnosis not present

## 2022-11-30 DIAGNOSIS — Z7189 Other specified counseling: Secondary | ICD-10-CM

## 2022-11-30 DIAGNOSIS — X32XXXA Exposure to sunlight, initial encounter: Secondary | ICD-10-CM

## 2022-11-30 DIAGNOSIS — L570 Actinic keratosis: Secondary | ICD-10-CM | POA: Diagnosis not present

## 2022-11-30 DIAGNOSIS — L309 Dermatitis, unspecified: Secondary | ICD-10-CM

## 2022-11-30 MED ORDER — MOMETASONE FUROATE 0.1 % EX CREA: TOPICAL_CREAM | CUTANEOUS | 0 refills | Status: AC

## 2022-11-30 NOTE — Progress Notes (Signed)
Follow-Up Visit   Subjective  Jessica Brewer is a 82 y.o. female who presents for the following: Spots on face. Dur: 8 weeks. Areas on temple have been frozen at last visit. ISK on left temple was frozen 11/17/2022. Has been using Elidel on area.  Has new pink spots on face. Spreading. Painful.  Eyelid dermatitis has improved since starting Elidel.   The patient has spots, moles and lesions to be evaluated, some may be new or changing and the patient has concerns that these could be cancer.  The following portions of the chart were reviewed this encounter and updated as appropriate: medications, allergies, medical history  Review of Systems:  No other skin or systemic complaints except as noted in HPI or Assessment and Plan.  Objective  Well appearing patient in no apparent distress; mood and affect are within normal limits.  A focused examination was performed of the following areas: Face, neck  Relevant physical exam findings are noted in the Assessment and Plan.  upper cutaneous lip x4 (4) Erythematous thin papules/macules with gritty scale.   face x20 (20) Erythematous keratotic or waxy stuck-on papule or plaque.   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 They are chronic with expected duration over 1 year. A portion of actinic keratoses will progress to squamous cell carcinoma of the skin. It is not possible to reliably predict which spots will progress to skin cancer and so treatment is recommended to prevent development of skin cancer.  Recommend daily broad spectrum sunscreen SPF 30+ to sun-exposed areas, reapply every 2 hours as needed.  Recommend staying in the shade or wearing long sleeves, sun glasses (UVA+UVB protection) and wide brim hats (4-inch brim around the entire circumference of the hat). Call for new or  changing lesions.  Destruction of lesion - upper cutaneous lip x4 Complexity: simple   Destruction method: cryotherapy   Informed consent: discussed and consent obtained   Timeout:  patient name, date of birth, surgical site, and procedure verified Lesion destroyed using liquid nitrogen: Yes   Region frozen until ice ball extended beyond lesion: Yes   Outcome: patient tolerated procedure well with no complications   Post-procedure details: wound care instructions given   Additional details:  Prior to procedure, discussed risks of blister formation, small wound, skin dyspigmentation, or rare scar following cryotherapy. Recommend Vaseline ointment to treated areas while healing.   Inflamed seborrheic keratosis (20) face x20  Symptomatic, irritating, patient would like treated.  Destruction of lesion - face x20 Complexity: simple   Destruction method: cryotherapy   Informed consent: discussed and consent obtained   Timeout:  patient name, date of birth, surgical site, and procedure verified Lesion destroyed using liquid nitrogen: Yes   Region frozen until ice ball extended beyond lesion: Yes   Outcome: patient tolerated procedure well with no complications   Post-procedure details: wound care instructions given   Additional details:  Prior to procedure, discussed risks of blister formation, small wound, skin dyspigmentation, or rare scar following cryotherapy. Recommend Vaseline ointment to treated areas while healing.   Treatment: Start Mometasone cream twice daily to affected areas on face 1-2 weeks as needed for irritated inflamed SKs.   ACTINIC DAMAGE - chronic, secondary to cumulative UV radiation exposure/sun exposure over time - diffuse scaly erythematous macules with underlying dyspigmentation - Recommend daily broad spectrum sunscreen SPF 30+ to  sun-exposed areas, reapply every 2 hours as needed.  - Recommend staying in the shade or wearing long sleeves, sun glasses (UVA+UVB  protection) and wide brim hats (4-inch brim around the entire circumference of the hat). - Call for new or changing lesions.  Eyelid Dermatitis/Contact Dermatitis   Exam: Very mild pinkness of upper eyelids Treatment Plan: No reactions to TRUE Test 36 patch testing.  Continue Elidel cream bid.     Return for Follow Up As Scheduled.  I, Lawson Radar, CMA, am acting as scribe for Armida Sans, MD.   Documentation: I have reviewed the above documentation for accuracy and completeness, and I agree with the above.  Armida Sans, MD

## 2022-11-30 NOTE — Patient Instructions (Addendum)
Cryotherapy Aftercare  Wash gently with soap and water everyday.   Apply Vaseline Jelly daily until healed.    For red/irritated areas on face: Start Mometasone cream twice daily to affected areas on face 1-2 weeks as needed.    Eyelids: Continue Elidel cream twice daily as needed for irritation.      Recommend daily broad spectrum sunscreen SPF 30+ to sun-exposed areas, reapply every 2 hours as needed. Call for new or changing lesions.  Staying in the shade or wearing long sleeves, sun glasses (UVA+UVB protection) and wide brim hats (4-inch brim around the entire circumference of the hat) are also recommended for sun protection.    Due to recent changes in healthcare laws, you may see results of your pathology and/or laboratory studies on MyChart before the doctors have had a chance to review them. We understand that in some cases there may be results that are confusing or concerning to you. Please understand that not all results are received at the same time and often the doctors may need to interpret multiple results in order to provide you with the best plan of care or course of treatment. Therefore, we ask that you please give [place] 2 business days to thoroughly review all your results before contacting the office for clarification. Should we see a critical lab result, you will be contacted sooner.   If You Need Anything After Your Visit  If you have any questions or concerns for your doctor, please call our main line at [phone] and press option 4 to reach your doctor's medical assistant. If no one answers, please leave a voicemail as directed and we will return your call as soon as possible. Messages left after 4 pm will be answered the following business day.   You may also send [place] a message via MyChart. We typically respond to MyChart messages within 1-2 business days.  For prescription refills, please ask your pharmacy to contact our office. Our fax number is  [phone].  If you have an urgent issue when the clinic is closed that cannot wait until the next business day, you can page your doctor at the number below.    Please note that while we do our best to be available for urgent issues outside of office hours, we are not available 24/7.   If you have an urgent issue and are unable to reach [place], you may choose to seek medical care at your doctor's office, retail clinic, urgent care center, or emergency room.  If you have a medical emergency, please immediately call 911 or go to the emergency department.  Pager Numbers  - Dr. Gwen Pounds: [phone]  - Dr. Neale Burly: [phone]  - Dr. Roseanne Reno: [phone]  In the event of inclement weather, please call our main line at [phone] for an update on the status of any delays or closures.  Dermatology Medication Tips: Please keep the boxes that topical medications come in in order to help keep track of the instructions about where and how to use these. Pharmacies typically print the medication instructions only on the boxes and not directly on the medication tubes.   If your medication is too expensive, please contact our office at [phone] option 4 or send [place] a message through MyChart.   We are unable to tell what your co-pay for medications will be in advance as this is different depending on your insurance coverage. However, we may be able to find a substitute medication at lower cost or fill out paperwork  to get insurance to cover a needed medication.   If a prior authorization is required to get your medication covered by your insurance company, please allow [place] 1-2 business days to complete this process.  Drug prices often vary depending on where the prescription is filled and some pharmacies may offer cheaper prices.  The website www.goodrx.com contains coupons for medications through different pharmacies. The prices here do not account for what the cost may be with help from  insurance (it may be cheaper with your insurance), but the website can give you the price if you did not use any insurance.  - You can print the associated coupon and take it with your prescription to the pharmacy.  - You may also stop by our office during regular business hours and pick up a GoodRx coupon card.  - If you need your prescription sent electronically to a different pharmacy, notify our office through [facility] or by phone at [phone] option 4.     Si Usted Necesita Algo Despus de Su Visita  Tambin puede enviarnos un mensaje a travs de Clinical cytogeneticist. Por lo general respondemos a los mensajes de MyChart en el transcurso de 1 a 2 das hbiles.  Para renovar recetas, por favor pida a su farmacia que se ponga en contacto con nuestra oficina. Annie Sable de fax es [place] [phone].  Si tiene un asunto urgente cuando la clnica est cerrada y que no puede esperar hasta el siguiente da hbil, puede llamar/localizar a su doctor(a) al nmero que aparece a continuacin.   Por favor, tenga en cuenta que aunque hacemos todo lo posible para estar disponibles para asuntos urgentes fuera del horario de [place], no estamos disponibles las 24 horas del da, los 7 [address]  [address] de la [place].   Si tiene un problema urgente y no puede comunicarse con nosotros, puede optar por buscar atencin mdica  en el consultorio de su doctor(a), en una clnica privada, en un centro de atencin urgente o en una sala de emergencias.  Si tiene Engineer, drilling, por favor llame inmediatamente al 911 o vaya a la sala de emergencias.  Nmeros de bper  - Dr. Gwen Pounds: [phone]  - Dra. Moye: [phone]  - Dra. Roseanne Reno: [phone]  En caso de inclemencias del [place], por favor llame a Lacy Duverney principal al [phone] para una actualizacin sobre el [place] de cualquier retraso o cierre.  Consejos para la medicacin en dermatologa: Por favor, guarde las cajas en las que vienen los  medicamentos de uso tpico para ayudarle a seguir las instrucciones sobre dnde y cmo usarlos. Las farmacias generalmente imprimen las instrucciones del medicamento slo en las cajas y no directamente en los tubos del [place].   Si su medicamento es muy caro, por favor, pngase en contacto con Rolm Gala llamando al [phone] y presione la opcin 4 o envenos un mensaje a travs de Clinical cytogeneticist.   No podemos decirle cul ser su copago por los medicamentos por adelantado ya que esto es diferente dependiendo de la cobertura de su seguro. Sin embargo, es posible que podamos encontrar un medicamento sustituto a Audiological scientist un formulario para que el seguro cubra el medicamento que se considera necesario.   Si se requiere una autorizacin previa para que su compaa de seguros [place] su medicamento, por favor permtanos de 1 a 2 das hbiles para completar [address]  Los precios de los medicamentos varan con frecuencia dependiendo del Environmental consultant de dnde se surte la receta y [place]  pueden ofrecer precios ms baratos.  El sitio web www.goodrx.com tiene cupones para medicamentos de Airline pilot. Los precios aqu no tienen en cuenta lo que podra costar con la ayuda del seguro (puede ser ms barato con su seguro), pero el sitio web puede darle el precio si no utiliz Research scientist (physical sciences).  - Puede imprimir el cupn correspondiente y llevarlo con su receta a la farmacia.  - Tambin puede pasar por nuestra oficina durante el horario de atencin regular y Charity fundraiser una tarjeta de cupones de GoodRx.  - Si necesita que su receta se enve electrnicamente a una farmacia diferente, informe a nuestra oficina a travs de MyChart de [place] o por telfono llamando al [phone] y presione la opcin 4.

## 2022-12-03 ENCOUNTER — Encounter: Payer: Self-pay | Admitting: Dermatology

## 2023-01-03 DIAGNOSIS — R7303 Prediabetes: Secondary | ICD-10-CM | POA: Insufficient documentation

## 2023-02-12 IMAGING — MG MM DIGITAL SCREENING BILAT W/ TOMO AND CAD
8 series · 8 of 24 positions shown · non-contrast
Comparison: Previous exam(s).

CLINICAL DATA: Screening.

EXAM:
DIGITAL SCREENING BILATERAL MAMMOGRAM WITH TOMOSYNTHESIS AND CAD
TECHNIQUE: Bilateral screening digital craniocaudal and mediolateral oblique
mammograms were obtained. Bilateral screening digital breast
tomosynthesis was performed. The images were evaluated with
computer-aided detection.

[R MLO synth-2D]
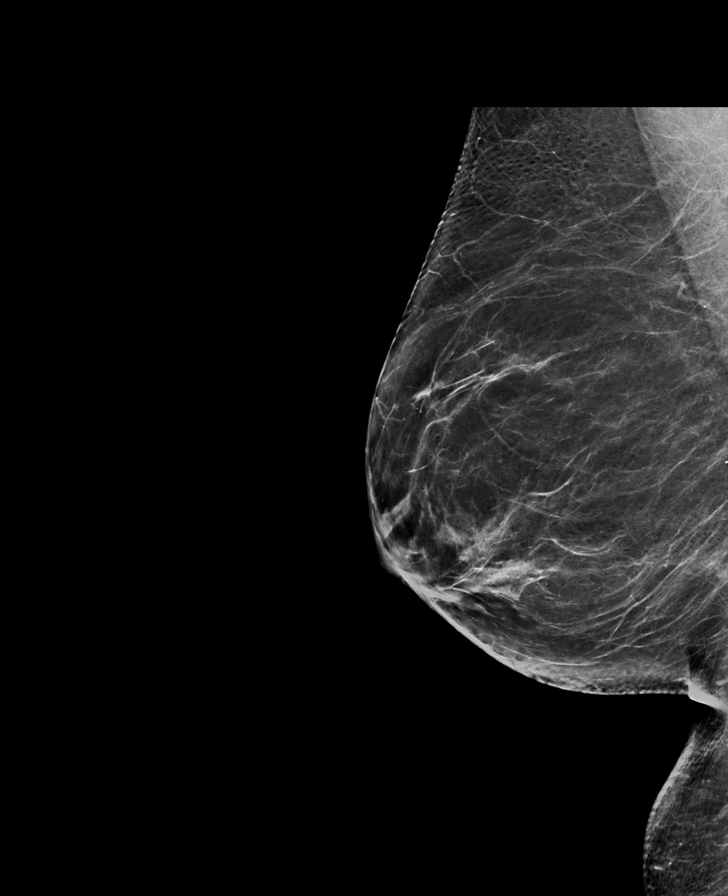

[R CC synth-2D]
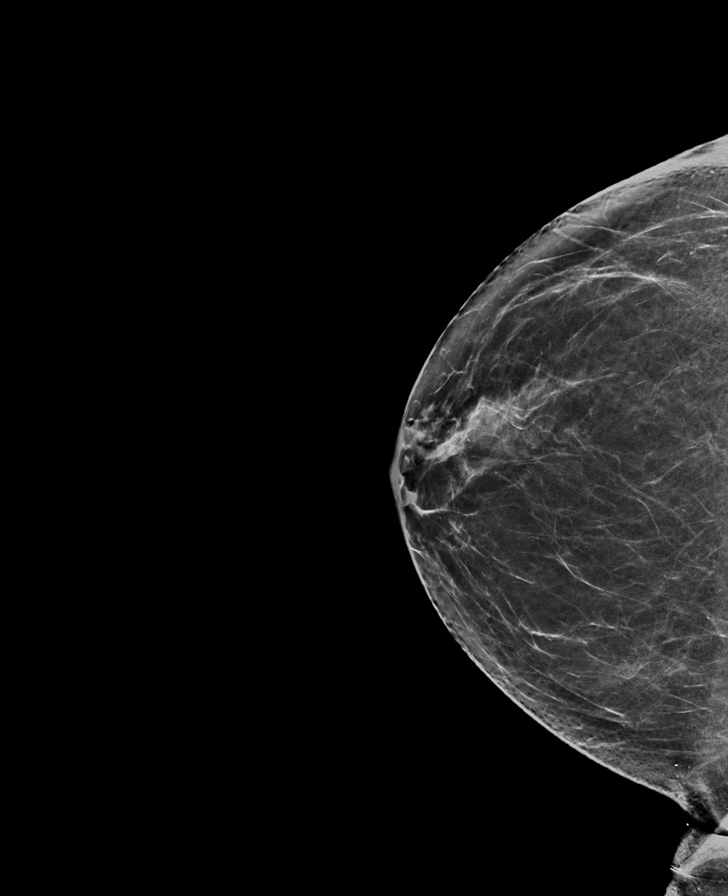

[L MLO synth-2D]
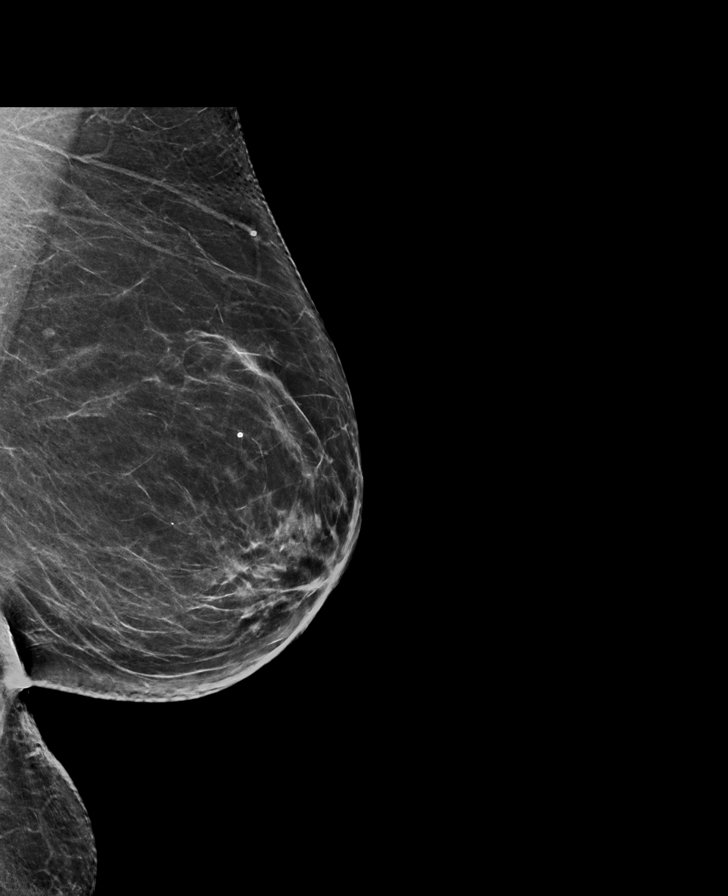

[L CC synth-2D]
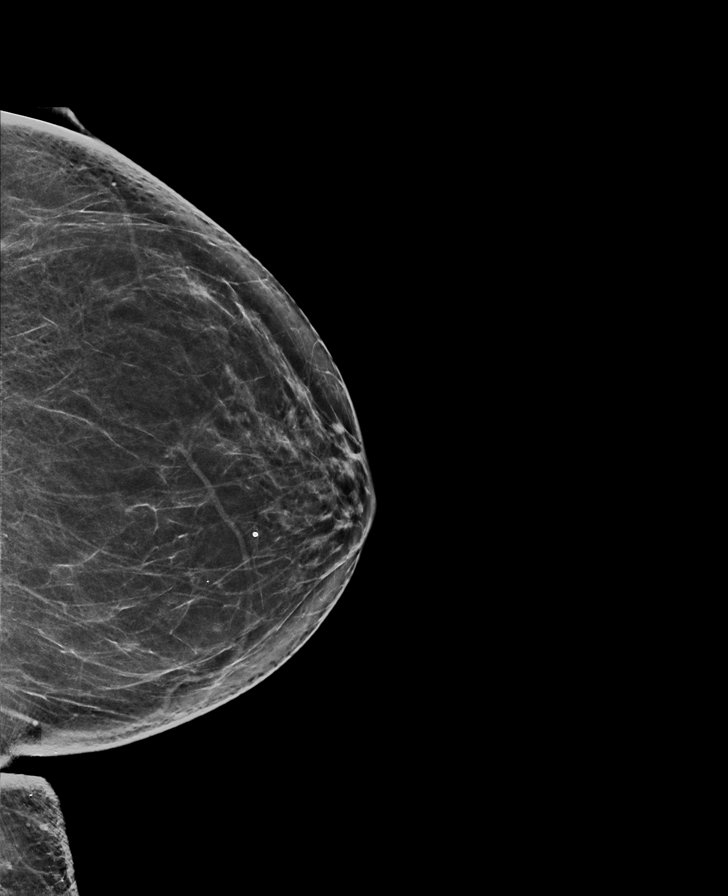

[R MLO tomo · tomo slice 38/75.0]
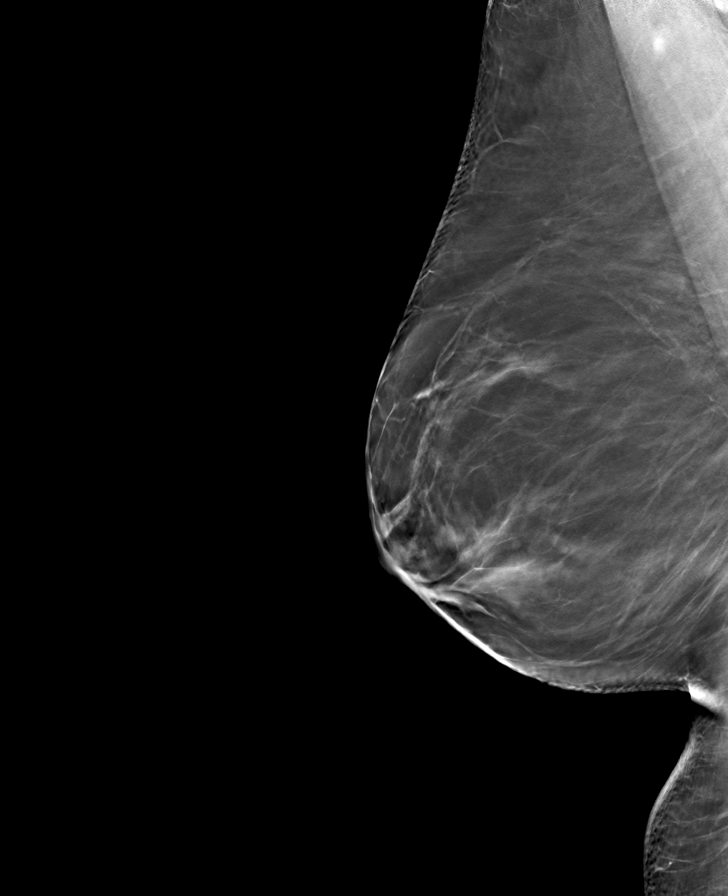

[L CC tomo · tomo slice 35/69.0]
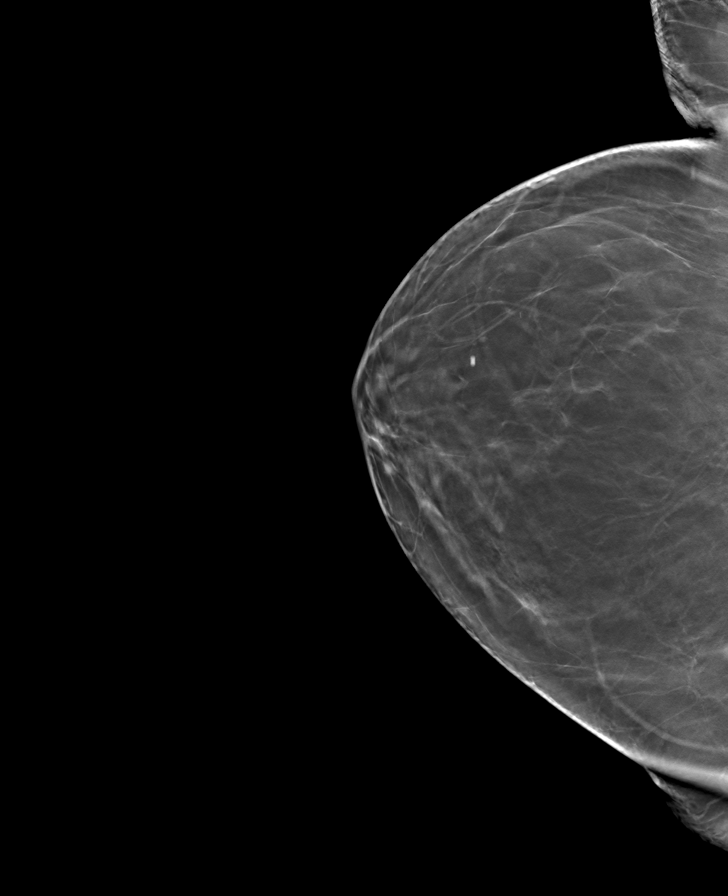

[L MLO tomo · tomo slice 39/76.0]
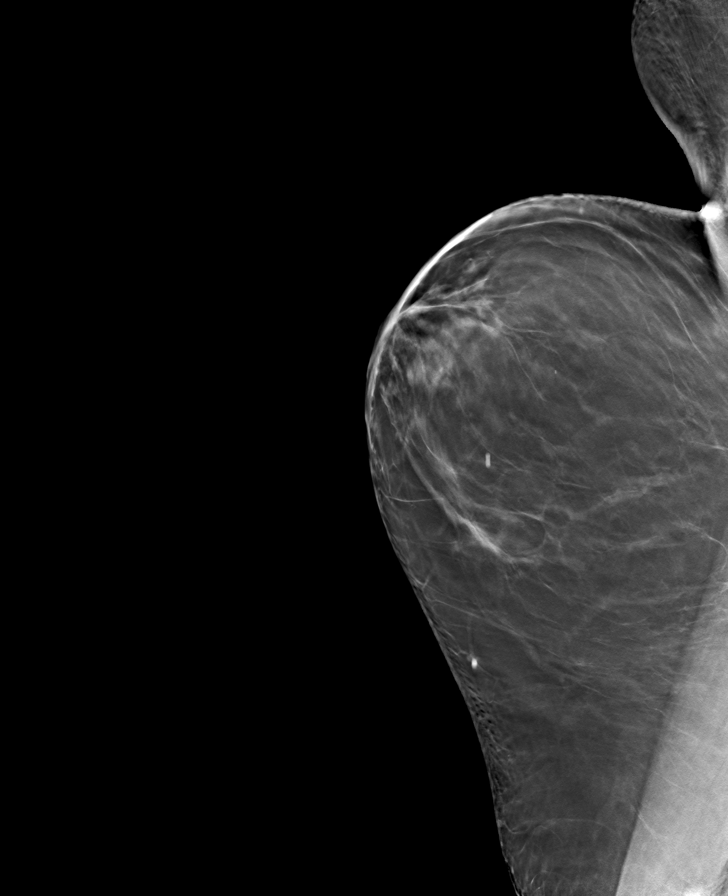

[R CC tomo · tomo slice 34/67.0]
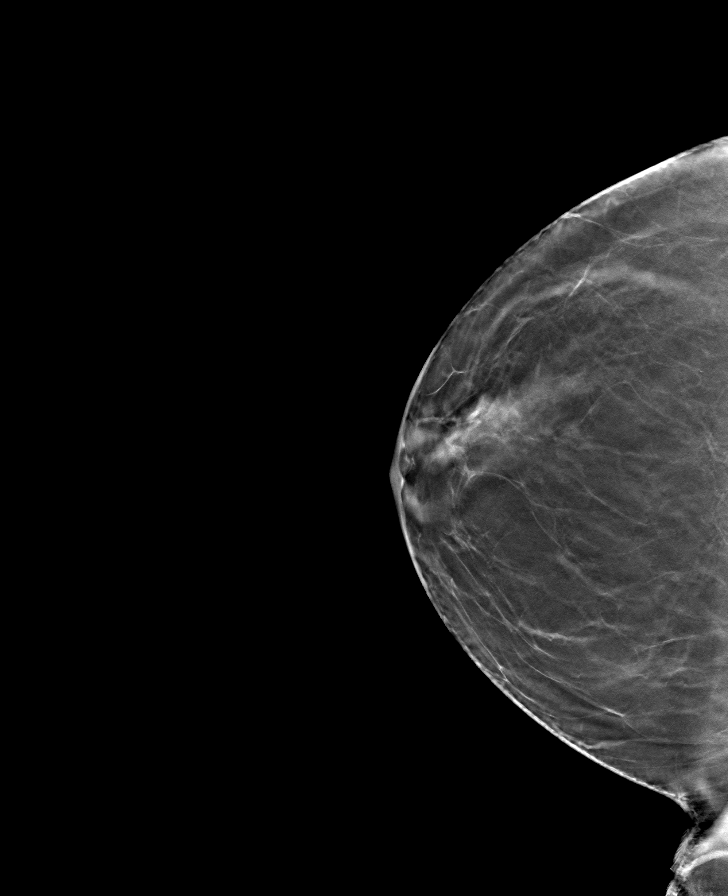

[8 of 24 positions shown; findings below may reference images not displayed]

ACR Breast Density Category b: There are scattered areas of
fibroglandular density.
FINDINGS: There are no findings suspicious for malignancy.
IMPRESSION: No mammographic evidence of malignancy. A result letter of this
screening mammogram will be mailed directly to the patient.

RECOMMENDATION:
Screening mammogram in one year. (Code:51-O-LD2)

BI-RADS CATEGORY  1: Negative.

## 2023-04-29 ENCOUNTER — Other Ambulatory Visit: Payer: Self-pay | Admitting: Internal Medicine

## 2023-04-29 DIAGNOSIS — Z1231 Encounter for screening mammogram for malignant neoplasm of breast: Secondary | ICD-10-CM

## 2023-05-26 ENCOUNTER — Ambulatory Visit
Admission: RE | Admit: 2023-05-26 | Discharge: 2023-05-26 | Disposition: A | Discharge status: Home / Self Care | Payer: Medicare HMO | Source: Ambulatory Visit | Attending: Internal Medicine | Admitting: Internal Medicine

## 2023-05-26 DIAGNOSIS — Z1231 Encounter for screening mammogram for malignant neoplasm of breast: Secondary | ICD-10-CM | POA: Diagnosis present

## 2023-07-20 ENCOUNTER — Ambulatory Visit: Payer: Medicare HMO | Admitting: Dermatology

## 2023-07-25 ENCOUNTER — Ambulatory Visit: Payer: Medicare Other | Admitting: Dermatology

## 2023-07-25 ENCOUNTER — Encounter: Payer: Self-pay | Admitting: Dermatology

## 2023-07-25 DIAGNOSIS — D229 Melanocytic nevi, unspecified: Secondary | ICD-10-CM

## 2023-07-25 DIAGNOSIS — H01135 Eczematous dermatitis of left lower eyelid: Secondary | ICD-10-CM

## 2023-07-25 DIAGNOSIS — L820 Inflamed seborrheic keratosis: Secondary | ICD-10-CM

## 2023-07-25 DIAGNOSIS — L821 Other seborrheic keratosis: Secondary | ICD-10-CM

## 2023-07-25 DIAGNOSIS — W908XXA Exposure to other nonionizing radiation, initial encounter: Secondary | ICD-10-CM

## 2023-07-25 DIAGNOSIS — L578 Other skin changes due to chronic exposure to nonionizing radiation: Secondary | ICD-10-CM | POA: Diagnosis not present

## 2023-07-25 DIAGNOSIS — D1801 Hemangioma of skin and subcutaneous tissue: Secondary | ICD-10-CM

## 2023-07-25 DIAGNOSIS — N951 Menopausal and female climacteric states: Secondary | ICD-10-CM | POA: Insufficient documentation

## 2023-07-25 DIAGNOSIS — L814 Other melanin hyperpigmentation: Secondary | ICD-10-CM

## 2023-07-25 DIAGNOSIS — H019 Unspecified inflammation of eyelid: Secondary | ICD-10-CM

## 2023-07-25 DIAGNOSIS — D649 Anemia, unspecified: Secondary | ICD-10-CM | POA: Insufficient documentation

## 2023-07-25 DIAGNOSIS — Z85828 Personal history of other malignant neoplasm of skin: Secondary | ICD-10-CM

## 2023-07-25 DIAGNOSIS — G43909 Migraine, unspecified, not intractable, without status migrainosus: Secondary | ICD-10-CM | POA: Insufficient documentation

## 2023-07-25 DIAGNOSIS — Z1283 Encounter for screening for malignant neoplasm of skin: Secondary | ICD-10-CM | POA: Diagnosis not present

## 2023-07-25 DIAGNOSIS — L219 Seborrheic dermatitis, unspecified: Secondary | ICD-10-CM

## 2023-07-25 NOTE — Progress Notes (Signed)
Follow-Up Visit   Subjective  Jessica Brewer is a 83 y.o. female who presents for the following: Skin Cancer Screening and Upper Body Skin Exam, hx of BCC on her nose. Patient concerned about several places on her face,back, shoulders and neck.    The patient presents for Upper Body Skin Exam (UBSE) for skin cancer screening and mole check. The patient has spots, moles and lesions to be evaluated, some may be new or changing and the patient may have concern these could be cancer.  Husband Ray- is with patient and contributes to history.   The following portions of the chart were reviewed this encounter and updated as appropriate: medications, allergies, medical history  Review of Systems:  No other skin or systemic complaints except as noted in HPI or Assessment and Plan.  Objective  Well appearing patient in no apparent distress; mood and affect are within normal limits.  All skin waist up examined. Relevant physical exam findings are noted in the Assessment and Plan.  right shoulder x 5, left forearm x 2, right forearmx 1, left shoulder x 2, back x 3 (13) Stuck-on, waxy, tan-brown papules and plaques -- Discussed benign etiology and prognosis.   Assessment & Plan   INFLAMED SEBORRHEIC KERATOSIS (13) right shoulder x 5, left forearm x 2, right forearmx 1, left shoulder x 2, back x 3 (13) Symptomatic, irritating, patient would like treated.  Destruction of lesion - right shoulder x 5, left forearm x 2, right forearmx 1, left shoulder x 2, back x 3 (13) Complexity: simple   Destruction method: cryotherapy   Informed consent: discussed and consent obtained   Timeout:  patient name, date of birth, surgical site, and procedure verified Lesion destroyed using liquid nitrogen: Yes   Region frozen until ice ball extended beyond lesion: Yes   Outcome: patient tolerated procedure well with no complications   Post-procedure details: wound care instructions given   Additional details:   Prior to procedure, discussed risks of blister formation, small wound, skin dyspigmentation, or rare scar following cryotherapy. Recommend Vaseline ointment to treated areas while healing.  Skin cancer screening performed today.  Actinic Damage - Chronic condition, secondary to cumulative UV/sun exposure - diffuse scaly erythematous macules with underlying dyspigmentation - Recommend daily broad spectrum sunscreen SPF 30+ to sun-exposed areas, reapply every 2 hours as needed.  - Staying in the shade or wearing long sleeves, sun glasses (UVA+UVB protection) and wide brim hats (4-inch brim around the entire circumference of the hat) are also recommended for sun protection.  - Call for new or changing lesions.  Lentigines, Seborrheic Keratoses, Hemangiomas - Benign normal skin lesions - Benign-appearing - Call for any changes  Melanocytic Nevi - Tan-brown and/or pink-flesh-colored symmetric macules and papules - Benign appearing on exam today - Observation - Call clinic for new or changing moles - Recommend daily use of broad spectrum spf 30+ sunscreen to sun-exposed areas.   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  - Call if any new or changing lesions are noted between office visits    EYELID DERMATITIS Pinkness of the upper eyelids   D/C Triamcinolone, mometasone, any other topical steroids Start Elidel cream apply to face bid   Topical steroids (such as triamcinolone, fluocinolone, fluocinonide, mometasone, clobetasol, halobetasol, betamethasone, hydrocortisone) can cause thinning and  lightening of the skin if they are used for too long in the same area. Your physician has selected the right strength medicine for your problem and area affected on the body. Please use your medication only as  directed by your physician to prevent side effects.      SEBORRHEIC DERMATITIS Exam: Pink patches with greasy scale at scalp   Seborrheic Dermatitis is a chronic persistent rash characterized by pinkness and scaling most commonly of the mid face but also can occur on the scalp (dandruff), ears; mid chest, mid back and groin.  It tends to be exacerbated by stress and cooler weather.  People who have neurologic disease may experience new onset or exacerbation of existing seborrheic dermatitis.  The condition is not curable but treatable and can be controlled.  Treatment Plan: Continue Otc dandruff shampoo Head and shoulders- cerave shampoo samples given   Return in about 6 months (around 01/22/2024) for dermatitis .  IAngelique Holm, CMA, am acting as scribe for Elie Goody, MD .   Documentation: I have reviewed the above documentation for accuracy and completeness, and I agree with the above.  Elie Goody, MD

## 2023-07-25 NOTE — Patient Instructions (Addendum)
 Cryotherapy Aftercare  Wash gently with soap and water everyday.   Apply Vaseline and Band-Aid daily until healed.    Due to recent changes in healthcare laws, you may see results of your pathology and/or laboratory studies on MyChart before the doctors have had a chance to review them. We understand that in some cases there may be results that are confusing or concerning to you. Please understand that not all results are received at the same time and often the doctors may need to interpret multiple results in order to provide you with the best plan of care or course of treatment. Therefore, we ask that you please give [place] 2 business days to thoroughly review all your results before contacting the office for clarification. Should we see a critical lab result, you will be contacted sooner.   If You Need Anything After Your Visit  If you have any questions or concerns for your doctor, please call our main line at [phone] and press option 4 to reach your doctor's medical assistant. If no one answers, please leave a voicemail as directed and we will return your call as soon as possible. Messages left after 4 pm will be answered the following business day.   You may also send [place] a message via MyChart. We typically respond to MyChart messages within 1-2 business days.  For prescription refills, please ask your pharmacy to contact our office. Our fax number is [phone].  If you have an urgent issue when the clinic is closed that cannot wait until the next business day, you can page your doctor at the number below.    Please note that while we do our best to be available for urgent issues outside of office hours, we are not available 24/7.   If you have an urgent issue and are unable to reach [place], you may choose to seek medical care at your doctor's office, retail clinic, urgent care center, or emergency room.  If you have a medical emergency, please immediately call 911 or go to the emergency  department.  Pager Numbers  - Dr. Gwen Pounds: [phone]  - Dr. Roseanne Reno: [phone]  - Dr. Katrinka Blazing: [phone]   In the event of inclement weather, please call our main line at [phone] for an update on the status of any delays or closures.  Dermatology Medication Tips: Please keep the boxes that topical medications come in in order to help keep track of the instructions about where and how to use these. Pharmacies typically print the medication instructions only on the boxes and not directly on the medication tubes.   If your medication is too expensive, please contact our office at [phone] option 4 or send [place] a message through MyChart.   We are unable to tell what your co-pay for medications will be in advance as this is different depending on your insurance coverage. However, we may be able to find a substitute medication at lower cost or fill out paperwork to get insurance to cover a needed medication.   If a prior authorization is required to get your medication covered by your insurance company, please allow [place] 1-2 business days to complete this process.  Drug prices often vary depending on where the prescription is filled and some pharmacies may offer cheaper prices.  The website www.goodrx.com contains coupons for medications through different pharmacies. The prices here do not account for what the cost may be with help from insurance (it may be cheaper with your insurance), but the website can  give you the price if you did not use any insurance.  - You can print the associated coupon and take it with your prescription to the pharmacy.  - You may also stop by our office during regular business hours and pick up a GoodRx coupon card.  - If you need your prescription sent electronically to a different pharmacy, notify our office through [facility] or by phone at [phone] option 4.     Si Usted Necesita Algo Despus de Su Visita  Tambin puede enviarnos  un mensaje a travs de Clinical cytogeneticist. Por lo general respondemos a los mensajes de MyChart en el transcurso de 1 a 2 das hbiles.  Para renovar recetas, por favor pida a su farmacia que se ponga en contacto con nuestra oficina. Annie Sable de fax es [place] [phone].  Si tiene un asunto urgente cuando la clnica est cerrada y que no puede esperar hasta el siguiente da hbil, puede llamar/localizar a su doctor(a) al nmero que aparece a continuacin.   Por favor, tenga en cuenta que aunque hacemos todo lo posible para estar disponibles para asuntos urgentes fuera del horario de [place], no estamos disponibles las 24 horas del da, los 7 [address]  [address] de la [place].   Si tiene un problema urgente y no puede comunicarse con nosotros, puede optar por buscar atencin mdica  en el consultorio de su doctor(a), en una clnica privada, en un centro de atencin urgente o en una sala de emergencias.  Si tiene Engineer, drilling, por favor llame inmediatamente al 911 o vaya a la sala de emergencias.  Nmeros de bper  - Dr. Gwen Pounds: [phone]  - Dra. Roseanne Reno: [phone]  - Dr. Katrinka Blazing: [phone]   En caso de inclemencias del tiempo, por favor llame a Lacy Duverney principal al [phone] para una actualizacin sobre el [place] de cualquier retraso o cierre.  Consejos para la medicacin en dermatologa: Por favor, guarde las cajas en las que vienen los medicamentos de uso tpico para ayudarle a seguir las instrucciones sobre dnde y cmo usarlos. Las farmacias generalmente imprimen las instrucciones del medicamento slo en las cajas y no directamente en los tubos del [place].   Si su medicamento es muy caro, por favor, pngase en contacto con Rolm Gala llamando al [phone] y presione la opcin 4 o envenos un mensaje a travs de Clinical cytogeneticist.   No podemos decirle cul ser su copago por los medicamentos por adelantado ya que esto es diferente dependiendo de la cobertura de su seguro. Sin  embargo, es posible que podamos encontrar un medicamento sustituto a Audiological scientist un formulario para que el seguro cubra el medicamento que se considera necesario.   Si se requiere una autorizacin previa para que su compaa de seguros [place] su medicamento, por favor permtanos de 1 a 2 das hbiles para completar [address]  Los precios de los medicamentos varan con frecuencia dependiendo del Environmental consultant de dnde se surte la receta y alguna farmacias pueden ofrecer precios ms baratos.  El sitio web www.goodrx.com tiene cupones para medicamentos de Health and safety inspector. Los precios aqu no tienen en cuenta lo que podra costar con la ayuda del seguro (puede ser ms barato con su seguro), pero el sitio web puede darle el precio si no utiliz Tourist information centre manager.  - Puede imprimir el cupn correspondiente y llevarlo con su receta a la farmacia.  - Tambin puede pasar por nuestra oficina durante el horario de atencin regular y Education officer, museum una tarjeta de cupones de GoodRx.  -  Si necesita que su receta se enve electrnicamente a Psychiatrist, informe a nuestra oficina a travs de MyChart de [place] o por telfono llamando al [phone] y presione la opcin 4.

## 2023-12-21 DIAGNOSIS — M4316 Spondylolisthesis, lumbar region: Secondary | ICD-10-CM | POA: Insufficient documentation

## 2024-01-23 ENCOUNTER — Ambulatory Visit: Payer: Medicare Other | Admitting: Dermatology

## 2024-01-23 ENCOUNTER — Encounter: Payer: Self-pay | Admitting: Dermatology

## 2024-01-23 DIAGNOSIS — L821 Other seborrheic keratosis: Secondary | ICD-10-CM

## 2024-01-23 DIAGNOSIS — L820 Inflamed seborrheic keratosis: Secondary | ICD-10-CM | POA: Diagnosis not present

## 2024-01-23 DIAGNOSIS — L570 Actinic keratosis: Secondary | ICD-10-CM

## 2024-01-23 DIAGNOSIS — L578 Other skin changes due to chronic exposure to nonionizing radiation: Secondary | ICD-10-CM

## 2024-01-23 DIAGNOSIS — N760 Acute vaginitis: Secondary | ICD-10-CM | POA: Insufficient documentation

## 2024-01-23 DIAGNOSIS — N894 Leukoplakia of vagina: Secondary | ICD-10-CM | POA: Insufficient documentation

## 2024-01-23 DIAGNOSIS — N6009 Solitary cyst of unspecified breast: Secondary | ICD-10-CM | POA: Insufficient documentation

## 2024-01-23 DIAGNOSIS — N840 Polyp of corpus uteri: Secondary | ICD-10-CM | POA: Insufficient documentation

## 2024-01-23 DIAGNOSIS — N814 Uterovaginal prolapse, unspecified: Secondary | ICD-10-CM | POA: Insufficient documentation

## 2024-01-23 DIAGNOSIS — L219 Seborrheic dermatitis, unspecified: Secondary | ICD-10-CM

## 2024-01-23 DIAGNOSIS — Z8711 Personal history of peptic ulcer disease: Secondary | ICD-10-CM | POA: Insufficient documentation

## 2024-01-23 DIAGNOSIS — W908XXA Exposure to other nonionizing radiation, initial encounter: Secondary | ICD-10-CM | POA: Diagnosis not present

## 2024-01-23 MED ORDER — KETOCONAZOLE 2 % EX CREA: TOPICAL_CREAM | CUTANEOUS | 3 refills | Status: AC

## 2024-01-23 MED ORDER — FLUOROURACIL 5 % EX CREA: TOPICAL_CREAM | CUTANEOUS | 0 refills | Status: AC

## 2024-01-23 NOTE — Patient Instructions (Addendum)
 For treatment of seborrheic dermatitis: Start Ketoconazole  cream apply twice a day to affected areas,, around nose, around mouth.   Call us  with name of prescription you have at home.   For treatment of precancers (Aks) - Start 5-fluorouracil  cream twice a day for up to 1 month to affected areas including temples, cheeks, forehead. Discontinue once irritation starts such as redness, irritation, or burning.   Reviewed course of treatment and expected reaction.  Patient advised to expect inflammation and crusting and advised that erosions are possible.  Patient advised to be diligent with sun protection during and after treatment. Handout with details of how to apply medication and what to expect provided. Counseled to keep medication out of reach of children and pets.   Reviewed course of treatment and expected reaction.  Patient advised to expect inflammation and crusting and advised that erosions are possible.  Patient advised to be diligent with sun protection during and after treatment. Counseled to keep medication out of reach of children and pets.  Cryotherapy Aftercare  Wash gently with soap and water everyday.   Apply Vaseline and Band-Aid daily until healed.       Due to recent changes in healthcare laws, you may see results of your pathology and/or laboratory studies on MyChart before the doctors have had a chance to review them. We understand that in some cases there may be results that are confusing or concerning to you. Please understand that not all results are received at the same time and often the doctors may need to interpret multiple results in order to provide you with the best plan of care or course of treatment. Therefore, we ask that you please give us  2 business days to thoroughly review all your results before contacting the office for clarification. Should we see a critical lab result, you will be contacted sooner.   If You Need Anything After Your Visit  If you have  any questions or concerns for your doctor, please call our main line at [phone] and press option 4 to reach your doctor's medical assistant. If no one answers, please leave a voicemail as directed and we will return your call as soon as possible. Messages left after 4 pm will be answered the following business day.   You may also send us  a message via MyChart. We typically respond to MyChart messages within 1-2 business days.  For prescription refills, please ask your pharmacy to contact our office. Our fax number is [phone].  If you have an urgent issue when the clinic is closed that cannot wait until the next business day, you can page your doctor at the number below.    Please note that while we do our best to be available for urgent issues outside of office hours, we are not available 24/7.   If you have an urgent issue and are unable to reach us , you may choose to seek medical care at your doctor's office, retail clinic, urgent care center, or emergency room.  If you have a medical emergency, please immediately call 911 or go to the emergency department.  Pager Numbers  - Dr. Hester: [phone]  - Dr. Jackquline: [phone]  - Dr. Claudene: [phone]   In the event of inclement weather, please call our main line at [phone] for an update on the status of any delays or closures.  Dermatology Medication Tips: Please keep the boxes that topical medications come in in order to help keep track of the instructions about where and  how to use these. Pharmacies typically print the medication instructions only on the boxes and not directly on the medication tubes.   If your medication is too expensive, please contact our office at [phone] option 4 or send us  a message through MyChart.   We are unable to tell what your co-pay for medications will be in advance as this is different depending on your insurance coverage. However, we may be able to find a substitute  medication at lower cost or fill out paperwork to get insurance to cover a needed medication.   If a prior authorization is required to get your medication covered by your insurance company, please allow us  1-2 business days to complete this process.  Drug prices often vary depending on where the prescription is filled and some pharmacies may offer cheaper prices.  The website www.goodrx.com contains coupons for medications through different pharmacies. The prices here do not account for what the cost may be with help from insurance (it may be cheaper with your insurance), but the website can give you the price if you did not use any insurance.  - You can print the associated coupon and take it with your prescription to the pharmacy.  - You may also stop by our office during regular business hours and pick up a GoodRx coupon card.  - If you need your prescription sent electronically to a different pharmacy, notify our office through [facility] or by phone at [phone] option 4.     Si Usted Necesita Algo Despus de Su Visita  Tambin puede enviarnos un mensaje a travs de Clinical cytogeneticist. Por lo general respondemos a los mensajes de MyChart en el transcurso de 1 a 2 das hbiles.  Para renovar recetas, por favor pida a su farmacia que se ponga en contacto con nuestra oficina. Randi lakes de fax es [place] [phone].  Si tiene un asunto urgente cuando la clnica est cerrada y que no puede esperar hasta el siguiente da hbil, puede llamar/localizar a su doctor(a) al nmero que aparece a continuacin.   Por favor, tenga en cuenta que aunque hacemos todo lo posible para estar disponibles para asuntos urgentes fuera del horario de [place], no estamos disponibles las 24 horas del da, los 7 [address]  [address] de la [place].   Si tiene un problema urgente y no puede comunicarse con nosotros, puede optar por buscar atencin mdica  en el consultorio de su doctor(a), en una clnica privada, en un centro de  atencin urgente o en una sala de emergencias.  Si tiene Engineer, drilling, por favor llame inmediatamente al 911 o vaya a la sala de emergencias.  Nmeros de bper  - Dr. Hester: [phone]  - Dra. Jackquline: [phone]  - Dr. Claudene: [phone]   En caso de inclemencias del tiempo, por favor llame a landry capes principal al [phone] para una actualizacin sobre el [place] de cualquier retraso o cierre.  Consejos para la medicacin en dermatologa: Por favor, guarde las cajas en las que vienen los medicamentos de uso tpico para ayudarle a seguir las instrucciones sobre dnde y cmo usarlos. Las farmacias generalmente imprimen las instrucciones del medicamento slo en las cajas y no directamente en los tubos del [place].   Si su medicamento es muy caro, por favor, pngase en contacto con landry rieger llamando al [phone] y presione la opcin 4 o envenos un mensaje a travs de Clinical cytogeneticist.   No podemos decirle cul ser su copago por los medicamentos por adelantado ya que esto es  diferente dependiendo de la cobertura de su seguro. Sin embargo, es posible que podamos encontrar un medicamento sustituto a Audiological scientist un formulario para que el seguro cubra el medicamento que se considera necesario.   Si se requiere una autorizacin previa para que su compaa de seguros [place] su medicamento, por favor permtanos de 1 a 2 das hbiles para completar este proceso.  Los precios de los medicamentos varan con frecuencia dependiendo del Environmental consultant de dnde se surte la receta y alguna farmacias pueden ofrecer precios ms baratos.  El sitio web www.goodrx.com tiene cupones para medicamentos de Health and safety inspector. Los precios aqu no tienen en cuenta lo que podra costar con la ayuda del seguro (puede ser ms barato con su seguro), pero el sitio web puede darle el precio si no utiliz Tourist information centre manager.  - Puede imprimir el cupn correspondiente y llevarlo con su receta a la  farmacia.  - Tambin puede pasar por nuestra oficina durante el horario de atencin regular y Education officer, museum una tarjeta de cupones de GoodRx.  - Si necesita que su receta se enve electrnicamente a una farmacia diferente, informe a nuestra oficina a travs de MyChart de [place] o por telfono llamando al [phone] y presione la opcin 4.

## 2024-01-23 NOTE — Progress Notes (Addendum)
 Follow-Up Visit   Subjective  Jessica Brewer is a 83 y.o. female who presents for the following: places at R nose, lower mouth, black spot at L shoulder, bilateral temples. Hx of BCC treated in [place] . Patient states that a different doctor prescribed something for dermatitis and helped (unsure of name), Elidel  did not help. Uses head and shoulders for scalp and works well.    The following portions of the chart were reviewed this encounter and updated as appropriate: medications, allergies, medical history  Review of Systems:  No other skin or systemic complaints except as noted in HPI or Assessment and Plan.  Objective  Well appearing patient in no apparent distress; mood and affect are within normal limits.  A focused examination was performed of the following areas: Face, scalp, L shoulder  Relevant exam findings are noted in the Assessment and Plan.  L zygoma x1 Pink scaly macules L shoulder x1, R temple x1 (2) Stuck on waxy paps with erythema  Assessment & Plan   ACTINIC DAMAGE WITH PRECANCEROUS ACTINIC KERATOSES Counseling for Topical Chemotherapy Management: Patient exhibits: - Severe, confluent actinic changes with pre-cancerous actinic keratoses that is secondary to cumulative UV radiation exposure over time - Condition that is severe; chronic, not at goal. - diffuse scaly erythematous macules and papules with underlying dyspigmentation - Discussed Prescription Field Treatment topical Chemotherapy for Severe, Chronic Confluent Actinic Changes with Pre-Cancerous Actinic Keratoses Field treatment involves treatment of an entire area of skin that has confluent Actinic Changes (Sun/ Ultraviolet light damage) and PreCancerous Actinic Keratoses by method of PhotoDynamic Therapy (PDT) and/or prescription Topical Chemotherapy agents such as 5-fluorouracil , 5-fluorouracil /calcipotriene, and/or imiquimod.  The purpose is to decrease the number of clinically evident and  subclinical PreCancerous lesions to prevent progression to development of skin cancer by chemically destroying early precancer changes that may or may not be visible.  It has been shown to reduce the risk of developing skin cancer in the treated area. As a result of treatment, redness, scaling, crusting, and open sores may occur during treatment course. One or more than one of these methods may be used and may have to be used several times to control, suppress and eliminate the PreCancerous changes. Discussed treatment course, expected reaction, and possible side effects. - Recommend daily broad spectrum sunscreen SPF 30+ to sun-exposed areas, reapply every 2 hours as needed.  - Staying in the shade or wearing long sleeves, sun glasses (UVA+UVB protection) and wide brim hats (4-inch brim around the entire circumference of the hat) are also recommended. - Call for new or changing lesions. - Start 5-fluorouracil  cream twice a day for up to 1 month to affected areas including temples, cheeks, forehead. Discontinue once irritation starts such as redness, irritation, or burning. Discussed treating one area at a time and allow to heal instead of treating entire face at one time. Reviewed course of treatment and expected reaction.  Patient advised to expect inflammation and crusting and advised that erosions are possible.  Patient advised to be diligent with sun protection during and after treatment. Handout with details of how to apply medication and what to expect provided. Counseled to keep medication out of reach of children and pets.  Reviewed course of treatment and expected reaction.  Patient advised to expect inflammation and crusting and advised that erosions are possible.  Patient advised to be diligent with sun protection during and after treatment. Counseled to keep medication out of reach of children and pets.    SEBORRHEIC DERMATITIS  Exam: Pink patches with greasy scale at alar creases, nasolabial  folds, perioral, R parietal occipital scalp  Chronic and persistent condition with duration or expected duration over one year. Condition is bothersome/symptomatic for patient. Currently flared.   Seborrheic Dermatitis is a chronic persistent rash characterized by pinkness and scaling most commonly of the mid face but also can occur on the scalp (dandruff), ears; mid chest, mid back and groin.  It tends to be exacerbated by stress and cooler weather.  People who have neurologic disease may experience new onset or exacerbation of existing seborrheic dermatitis.  The condition is not curable but treatable and can be controlled.  Treatment Plan: May continue Otc dandruff shampoo Head and shoulders for scalp.   Start Ketoconazole  cream apply BID to aa, around nose, around mouth.   Patient will call us  with name of rx she has at home and let us  know. -> it was tacrolimus ointment  Avoid getting efudex  on these areas, will cause flare  SEBORRHEIC KERATOSIS - Stuck-on, waxy, tan-brown papules and/or plaques at chest - Benign-appearing - Discussed benign etiology and prognosis. - Observe - Call for any changes  AK (ACTINIC KERATOSIS) L zygoma x1 Actinic keratoses are precancerous spots that appear secondary to cumulative UV radiation exposure/sun exposure over time. They are chronic with expected duration over 1 year. A portion of actinic keratoses will progress to squamous cell carcinoma of the skin. It is not possible to reliably predict which spots will progress to skin cancer and so treatment is recommended to prevent development of skin cancer.  Recommend daily broad spectrum sunscreen SPF 30+ to sun-exposed areas, reapply every 2 hours as needed.  Recommend staying in the shade or wearing long sleeves, sun glasses (UVA+UVB protection) and wide brim hats (4-inch brim around the entire circumference of the hat). Call for new or changing lesions. Destruction of lesion - L zygoma  x1 Complexity: simple   Destruction method: cryotherapy   Informed consent: discussed and consent obtained   Timeout:  patient name, date of birth, surgical site, and procedure verified Lesion destroyed using liquid nitrogen: Yes   Region frozen until ice ball extended beyond lesion: Yes   Cryo cycles: 1 or 2. Outcome: patient tolerated procedure well with no complications   Post-procedure details: wound care instructions given    Related Medications fluorouracil  (EFUDEX ) 5 % cream Apply BID for up to 1 month to affected areas including temples, cheeks, forehead. Discontinue once irritation starts such as redness, irritation, or burning. INFLAMED SEBORRHEIC KERATOSIS (2) L shoulder x1, R temple x1 (2) Symptomatic, irritating, patient would like treated. Destruction of lesion - L shoulder x1, R temple x1 (2) Complexity: simple   Destruction method: cryotherapy   Informed consent: discussed and consent obtained   Timeout:  patient name, date of birth, surgical site, and procedure verified Lesion destroyed using liquid nitrogen: Yes   Region frozen until ice ball extended beyond lesion: Yes   Cryo cycles: 1 or 2. Outcome: patient tolerated procedure well with no complications   Post-procedure details: wound care instructions given    SEBORRHEIC DERMATITIS   Related Medications ketoconazole  (NIZORAL ) 2 % cream Apply BID to aa, around nose, around mouth. ACTINIC ELASTOSIS   SEBORRHEIC KERATOSES    Return in about 3 months (around 04/24/2024) for w/ Dr. Claudene, [place] follow-up.  I, Jacquelynn V. Wilfred, CMA, am acting as scribe for Boneta Claudene, MD .   Documentation: I have reviewed the above documentation for accuracy and completeness, and I  agree with the above.  Boneta Sharps, MD

## 2024-02-06 ENCOUNTER — Telehealth: Payer: Self-pay

## 2024-02-06 MED ORDER — HYDROCORTISONE 2.5 % EX CREA: TOPICAL_CREAM | Freq: Two times a day (BID) | CUTANEOUS | 0 refills | Status: AC | PRN

## 2024-02-06 NOTE — Telephone Encounter (Signed)
 Patient came in regarding reaction around mouth.  Patient has been using 5Fu/Calcipotriene Cream to cheeks/temples but states she is cleaning her hands off and then applying Ketoconazole  Cream around mouth. Photos taken.  Per Dr. Claudene patient to d/c 5FU and Ketoconazole  Cream and apply Hydrocortisone  twice daily until area is healed. Once skin is clear stop medications until follow up with Dr. Claudene in November. aw

## 2024-04-25 ENCOUNTER — Other Ambulatory Visit: Payer: Self-pay | Admitting: Sports Medicine

## 2024-04-25 DIAGNOSIS — M25552 Pain in left hip: Secondary | ICD-10-CM

## 2024-04-25 DIAGNOSIS — M7062 Trochanteric bursitis, left hip: Secondary | ICD-10-CM

## 2024-04-28 ENCOUNTER — Ambulatory Visit
Admission: RE | Admit: 2024-04-28 | Discharge: 2024-04-28 | Disposition: A | Discharge status: Home / Self Care | Source: Ambulatory Visit | Attending: Sports Medicine | Admitting: Sports Medicine

## 2024-04-28 DIAGNOSIS — M25552 Pain in left hip: Secondary | ICD-10-CM | POA: Insufficient documentation

## 2024-04-28 DIAGNOSIS — M7062 Trochanteric bursitis, left hip: Secondary | ICD-10-CM | POA: Insufficient documentation

## 2024-04-30 ENCOUNTER — Ambulatory Visit: Admitting: Dermatology

## 2024-05-25 ENCOUNTER — Other Ambulatory Visit: Payer: Self-pay | Admitting: Internal Medicine

## 2024-05-25 DIAGNOSIS — Z1231 Encounter for screening mammogram for malignant neoplasm of breast: Secondary | ICD-10-CM

## 2024-06-20 ENCOUNTER — Ambulatory Visit
Admission: RE | Admit: 2024-06-20 | Discharge: 2024-06-20 | Disposition: A | Discharge status: Home / Self Care | Source: Ambulatory Visit | Attending: Internal Medicine | Admitting: Internal Medicine

## 2024-06-20 DIAGNOSIS — Z1231 Encounter for screening mammogram for malignant neoplasm of breast: Secondary | ICD-10-CM | POA: Insufficient documentation
